# Patient Record
Sex: Male | Born: 1990
Health system: Southern US, Community
[De-identification: ages and names within clinical notes are randomized; demographics above are authoritative.]

## PROBLEM LIST (undated history)

## (undated) DIAGNOSIS — W3400XA Accidental discharge from unspecified firearms or gun, initial encounter: Secondary | ICD-10-CM

## (undated) DIAGNOSIS — T7840XA Allergy, unspecified, initial encounter: Secondary | ICD-10-CM

## (undated) DIAGNOSIS — E785 Hyperlipidemia, unspecified: Secondary | ICD-10-CM

## (undated) DIAGNOSIS — Z87898 Personal history of other specified conditions: Secondary | ICD-10-CM

## (undated) DIAGNOSIS — R001 Bradycardia, unspecified: Secondary | ICD-10-CM

## (undated) DIAGNOSIS — Z8614 Personal history of Methicillin resistant Staphylococcus aureus infection: Secondary | ICD-10-CM

## (undated) HISTORY — DX: Allergy, unspecified, initial encounter: T78.40XA

## (undated) HISTORY — DX: Hyperlipidemia, unspecified: E78.5

## (undated) HISTORY — DX: Personal history of Methicillin resistant Staphylococcus aureus infection: Z86.14

## (undated) HISTORY — DX: Accidental discharge from unspecified firearms or gun, initial encounter: W34.00XA

## (undated) HISTORY — DX: Bradycardia, unspecified: R00.1

---
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
## 2011-08-30 DIAGNOSIS — W3400XA Accidental discharge from unspecified firearms or gun, initial encounter: Secondary | ICD-10-CM

## 2011-08-30 HISTORY — DX: Accidental discharge from unspecified firearms or gun, initial encounter: W34.00XA

## 2014-08-29 HISTORY — PX: WISDOM TOOTH EXTRACTION: SHX21

## 2015-01-29 ENCOUNTER — Ambulatory Visit (INDEPENDENT_AMBULATORY_CARE_PROVIDER_SITE_OTHER): Payer: 59 | Admitting: Physician Assistant

## 2015-01-29 ENCOUNTER — Encounter: Payer: Self-pay | Admitting: Physician Assistant

## 2015-01-29 VITALS — BP 122/80 | HR 80 | Temp 97.7°F | Resp 16 | Ht 66.0 in | Wt 141.0 lb

## 2015-01-29 DIAGNOSIS — R5383 Other fatigue: Secondary | ICD-10-CM

## 2015-01-29 DIAGNOSIS — Z79899 Other long term (current) drug therapy: Secondary | ICD-10-CM

## 2015-01-29 DIAGNOSIS — Z91048 Other nonmedicinal substance allergy status: Secondary | ICD-10-CM

## 2015-01-29 DIAGNOSIS — Z0001 Encounter for general adult medical examination with abnormal findings: Secondary | ICD-10-CM

## 2015-01-29 DIAGNOSIS — R6889 Other general symptoms and signs: Secondary | ICD-10-CM

## 2015-01-29 DIAGNOSIS — Z9109 Other allergy status, other than to drugs and biological substances: Secondary | ICD-10-CM | POA: Insufficient documentation

## 2015-01-29 DIAGNOSIS — M25561 Pain in right knee: Secondary | ICD-10-CM

## 2015-01-29 DIAGNOSIS — Z711 Person with feared health complaint in whom no diagnosis is made: Secondary | ICD-10-CM

## 2015-01-29 DIAGNOSIS — M791 Myalgia, unspecified site: Secondary | ICD-10-CM

## 2015-01-29 DIAGNOSIS — E559 Vitamin D deficiency, unspecified: Secondary | ICD-10-CM

## 2015-01-29 LAB — BASIC METABOLIC PANEL WITH GFR
BUN: 15 mg/dL (ref 6–23)
CALCIUM: 9.8 mg/dL (ref 8.4–10.5)
CHLORIDE: 100 meq/L (ref 96–112)
CO2: 26 mEq/L (ref 19–32)
CREATININE: 1.02 mg/dL (ref 0.50–1.35)
GFR, Est African American: >89 mL/min
Glucose, Bld: 87 mg/dL (ref 70–99)
Potassium: 4.2 mEq/L (ref 3.5–5.3)
Sodium: 138 mEq/L (ref 135–145)

## 2015-01-29 LAB — CBC WITH DIFFERENTIAL/PLATELET
Basophils Absolute: 0.1 10*3/uL (ref 0.0–0.1)
Basophils Relative: 1 % (ref 0–1)
EOS PCT: 3 % (ref 0–5)
Eosinophils Absolute: 0.2 10*3/uL (ref 0.0–0.7)
HEMATOCRIT: 44.4 % (ref 39.0–52.0)
HEMOGLOBIN: 15.3 g/dL (ref 13.0–17.0)
LYMPHS ABS: 2 10*3/uL (ref 0.7–4.0)
Lymphocytes Relative: 36 % (ref 12–46)
MCH: 30.3 pg (ref 26.0–34.0)
MCHC: 34.5 g/dL (ref 30.0–36.0)
MCV: 87.9 fL (ref 78.0–100.0)
MPV: 8.7 fL (ref 8.6–12.4)
Monocytes Absolute: 0.6 10*3/uL (ref 0.1–1.0)
Monocytes Relative: 10 % (ref 3–12)
NEUTROS ABS: 2.8 10*3/uL (ref 1.7–7.7)
Neutrophils Relative %: 50 % (ref 43–77)
Platelets: 312 10*3/uL (ref 150–400)
RBC: 5.05 MIL/uL (ref 4.22–5.81)
RDW: 13.6 % (ref 11.5–15.5)
WBC: 5.5 10*3/uL (ref 4.0–10.5)

## 2015-01-29 LAB — HEPATIC FUNCTION PANEL
ALBUMIN: 4.8 g/dL (ref 3.5–5.2)
ALT: 16 U/L (ref 0–53)
AST: 20 U/L (ref 0–37)
Alkaline Phosphatase: 57 U/L (ref 39–117)
BILIRUBIN INDIRECT: 0.7 mg/dL (ref 0.2–1.2)
BILIRUBIN TOTAL: 0.8 mg/dL (ref 0.2–1.2)
Bilirubin, Direct: 0.1 mg/dL (ref 0.0–0.3)
TOTAL PROTEIN: 7.2 g/dL (ref 6.0–8.3)

## 2015-01-29 LAB — HEMOGLOBIN A1C
Hgb A1c MFr Bld: 5.5 % (ref <5.7)
Mean Plasma Glucose: 111 mg/dL (ref <117)

## 2015-01-29 LAB — LIPID PANEL
Cholesterol: 235 mg/dL — ABNORMAL HIGH (ref 0–200)
HDL: 56 mg/dL (ref >=40)
LDL Cholesterol: 168 mg/dL — ABNORMAL HIGH (ref 0–99)
TRIGLYCERIDES: 54 mg/dL (ref <150)
Total CHOL/HDL Ratio: 4.2 Ratio
VLDL: 11 mg/dL (ref 0–40)

## 2015-01-29 LAB — MAGNESIUM: MAGNESIUM: 1.9 mg/dL (ref 1.5–2.5)

## 2015-01-29 LAB — HEPATITIS C ANTIBODY: HCV AB: NEGATIVE

## 2015-01-29 LAB — TSH: TSH: 1.023 u[IU]/mL (ref 0.350–4.500)

## 2015-01-29 LAB — CK: Total CK: 228 U/L (ref 7–232)

## 2015-01-29 MED ORDER — MELOXICAM 15 MG PO TABS: ORAL_TABLET | ORAL | Status: DC

## 2015-01-29 NOTE — Patient Instructions (Signed)
Preventive Care for Adults A healthy lifestyle and preventive care can promote health and wellness. Preventive health guidelines for men include the following key practices: 1. A routine yearly physical is a good way to check with your health care provider about your health and preventative screening. It is a chance to share any concerns and updates on your health and to receive a thorough exam. 2. Visit your dentist for a routine exam and preventative care every 6 months. Brush your teeth twice a day and floss once a day. Good oral hygiene prevents tooth decay and gum disease. 3. The frequency of eye exams is based on your age, health, family medical history, use of contact lenses, and other factors. Follow your health care provider's recommendations for frequency of eye exams. 4. Eat a healthy diet. Foods such as vegetables, fruits, whole grains, low-fat dairy products, and lean protein foods contain the nutrients you need without too many calories. Decrease your intake of foods high in solid fats, added sugars, and salt. Eat the right amount of calories for you.Get information about a proper diet from your health care provider, if necessary. 5. Regular physical exercise is one of the most important things you can do for your health. Most adults should get at least 150 minutes of moderate-intensity exercise (any activity that increases your heart rate and causes you to sweat) each week. In addition, most adults need muscle-strengthening exercises on 2 or more days a week. 6. Maintain a healthy weight. The body mass index (BMI) is a screening tool to identify possible weight problems. It provides an estimate of body fat based on height and weight. Your health care provider can find your BMI and can help you achieve or maintain a healthy weight.For adults 20 years and older: 1. A BMI below 18.5 is considered underweight. 2. A BMI of 18.5 to 24.9 is normal. 3. A BMI of 25 to 29.9 is considered  overweight. 4. A BMI of 30 and above is considered obese. 7. Maintain normal blood lipids and cholesterol levels by exercising and minimizing your intake of saturated fat. Eat a balanced diet with plenty of fruit and vegetables. Blood tests for lipids and cholesterol should begin at age 23 and be repeated every 5 years. If your lipid or cholesterol levels are high, you are over 50, or you are at high risk for heart disease, you may need your cholesterol levels checked more frequently.Ongoing high lipid and cholesterol levels should be treated with medicines if diet and exercise are not working. 8. If you smoke, find out from your health care provider how to quit. If you do not use tobacco, do not start. 9. Lung cancer screening is recommended for adults aged 92-80 years who are at high risk for developing lung cancer because of a history of smoking. A yearly low-dose CT scan of the lungs is recommended for people who have at least a 30-pack-year history of smoking and are a current smoker or have quit within the past 15 years. A pack year of smoking is smoking an average of 1 pack of cigarettes a day for 1 year (for example: 1 pack a day for 30 years or 2 packs a day for 15 years). Yearly screening should continue until the smoker has stopped smoking for at least 15 years. Yearly screening should be stopped for people who develop a health problem that would prevent them from having lung cancer treatment. 10. If you choose to drink alcohol, do not have more than  2 drinks per day. One drink is considered to be 12 ounces (355 mL) of beer, 5 ounces (148 mL) of wine, or 1.5 ounces (44 mL) of liquor. 11. High blood pressure causes heart disease and increases the risk of stroke. Your blood pressure should be checked. Ongoing high blood pressure should be treated with medicines, if weight loss and exercise are not effective. 53. If you are 2-76 years old, ask your health care provider if you should take aspirin to  prevent heart disease. 13. Diabetes screening involves taking a blood sample to check your fasting blood sugar level. Testing should be considered at a younger age or be carried out more frequently if you are overweight and have at least 1 risk factor for diabetes. 14. Colorectal cancer can be detected and often prevented. Most routine colorectal cancer screening begins at the age of 36 and continues through age 22. However, your health care provider may recommend screening at an earlier age if you have risk factors for colon cancer. On a yearly basis, your health care provider may provide home test kits to check for hidden blood in the stool. Use of a small camera at the end of a tube to directly examine the colon (sigmoidoscopy or colonoscopy) can detect the earliest forms of colorectal cancer. Talk to your health care provider about this at age 62, when routine screening begins. Direct exam of the colon should be repeated every 5-10 years through age 106, unless early forms of precancerous polyps or small growths are found. 11. Screening for abdominal aortic aneurysm (AAA)  are recommended for persons over age 78 who have history of hypertensionor who are current or former smokers. 3. Talk with your health care provider about prostate cancer screening. 50. Testicular cancer screening is recommended for adult males. Screening includes self-exam, a health care provider exam, and other screening tests. Consult with your health care provider about any symptoms you have or any concerns you have about testicular cancer. 18. Use sunscreen. Apply sunscreen liberally and repeatedly throughout the day. You should seek shade when your shadow is shorter than you. Protect yourself by wearing long sleeves, pants, a wide-brimmed hat, and sunglasses year round, whenever you are outdoors. 19. Once a month, do a whole-body skin exam, using a mirror to look at the skin on your back. Tell your health care provider about new  moles, moles that have irregular borders, moles that are larger than a pencil eraser, or moles that have changed in shape or color. 20. Stay current with required vaccines (immunizations). 1. Influenza vaccine. All adults should be immunized every year. 2. Tetanus, diphtheria, and acellular pertussis (Td, Tdap) vaccine. An adult who has not previously received Tdap or who does not know his vaccine status should receive 1 dose of Tdap. This initial dose should be followed by tetanus and diphtheria toxoids (Td) booster doses every 10 years. Adults with an unknown or incomplete history of completing a 3-dose immunization series with Td-containing vaccines should begin or complete a primary immunization series including a Tdap dose. Adults should receive a Td booster every 10 years. 3. Zoster vaccine. One dose is recommended for adults aged 27 years or older unless certain conditions are present. 4.  5. Pneumococcal 13-valent conjugate (PCV13) vaccine. When indicated, a person who is uncertain of his immunization history and has no record of immunization should receive the PCV13 vaccine. An adult aged 34 years or older who has certain medical conditions and has not been previously immunized should  receive 1 dose of PCV13 vaccine. This PCV13 should be followed with a dose of pneumococcal polysaccharide (PPSV23) vaccine. The PPSV23 vaccine dose should be obtained at least 8 weeks after the dose of PCV13 vaccine. An adult aged 5 years or older who has certain medical conditions and previously received 1 or more doses of PPSV23 vaccine should receive 1 dose of PCV13. The PCV13 vaccine dose should be obtained 1 or more years after the last PPSV23 vaccine dose. 6.  7. Pneumococcal polysaccharide (PPSV23) vaccine. When PCV13 is also indicated, PCV13 should be obtained first. All adults aged 55 years and older should be immunized. An adult younger than age 45 years who has certain medical conditions should be  immunized. Any person who resides in a nursing home or long-term care facility should be immunized. An adult smoker should be immunized. People with an immunocompromised condition and certain other conditions should receive both PCV13 and PPSV23 vaccines. People with human immunodeficiency virus (HIV) infection should be immunized as soon as possible after diagnosis. Immunization during chemotherapy or radiation therapy should be avoided. Routine use of PPSV23 vaccine is not recommended for American Indians, [place] Natives, or people younger than 65 years unless there are medical conditions that require PPSV23 vaccine. When indicated, people who have unknown immunization and have no record of immunization should receive PPSV23 vaccine. One-time revaccination 5 years after the first dose of PPSV23 is recommended for people aged 19-64 years who have chronic kidney failure, nephrotic syndrome, asplenia, or immunocompromised conditions. People who received 1-2 doses of PPSV23 before age 63 years should receive another dose of PPSV23 vaccine at age 56 years or later if at least 5 years have passed since the previous dose. Doses of PPSV23 are not needed for people immunized with PPSV23 at or after age 66 years. 8. Hepatitis A vaccine. Adults who wish to be protected from this disease, have certain high-risk conditions, work with hepatitis A-infected animals, work in hepatitis A research labs, or travel to or work in countries with a high rate of hepatitis A should be immunized. Adults who were previously unvaccinated and who anticipate close contact with an international adoptee during the first 60 days after arrival in the [place] from a country with a high rate of hepatitis A should be immunized. 9. Hepatitis B vaccine. Adults should be immunized if they wish to be protected from this disease, have certain high-risk conditions, may be exposed to blood or other infectious body fluids, are household contacts or  sex partners of hepatitis B positive people, are clients or workers in certain care facilities, or travel to or work in countries with a high rate of hepatitis B.  Preventive Service / Frequency  Ages 49 to 38 1. Blood pressure check. 2. Lipid and cholesterol check. 3. Hepatitis C blood test.** / For any individual with known risks for hepatitis C. 4. Skin self-exam. / Monthly. 5. Influenza vaccine. / Every year. 6. Tetanus, diphtheria, and acellular pertussis (Tdap, Td) vaccine.** / Consult your health care provider. 1 dose of Td every 10 years. 7. HPV vaccine. / 3 doses over 6 months, if 73 or younger. 8. Measles, mumps, rubella (MMR) vaccine.** / You need at least 1 dose of MMR if you were born in 1957 or later. You may also need a second dose. 9. Pneumococcal 13-valent conjugate (PCV13) vaccine.** / Consult your health care provider. 10. Pneumococcal polysaccharide (PPSV23) vaccine.** / 1 to 2 doses if you smoke cigarettes or if you have certain  conditions. 11. Meningococcal vaccine.** / 1 dose if you are age 65 to 27 years and a Market researcher living in a residence hall, or have one of several medical conditions. You may also need additional booster doses. 12. Hepatitis A vaccine.** / Consult your health care provider. 13. Hepatitis B vaccine.** / Consult your health care provider.  Keeping you healthy  Chlamydia, HIV, and other sexual transmitted disease- Get screened each year until the age of 51 then within three months of each new sexual partner.  Don't smoke- If you do smoke, ask your healthcare provider about quitting. For tips on how to quit, go to www.smokefree.gov or call 1-800-QUIT-NOW.  Be physically active- Exercise 5 days a week for at least 30 minutes.  If you are not already physically active start slow and gradually work up to 30 minutes of moderate physical activity.  Examples of moderate activity include walking briskly, mowing the yard, dancing, swimming  bicycling, etc.  Eat a healthy diet- Eat a variety of healthy foods such as fruits, vegetables, low fat milk, low fat cheese, yogurt, lean meats, poultry, fish, beans, tofu, etc.  For more information on healthy eating, go to www.thenutritionsource.org  Drink alcohol in moderation- Limit alcohol intake two drinks or less a day.  Never drink and drive.  Dentist- Brush and floss teeth twice daily; visit your dentis twice a year.  Depression-Your emotional health is as important as your physical health.  If you're feeling down, losing interest in things you normally enjoy please talk with your healthcare provider.  Gun Safety- If you keep a gun in your home, keep it unloaded and with the safety lock on.  Bullets should be stored separately.  Helmet use- Always wear a helmet when riding a motorcycle, bicycle, rollerblading or skateboarding.  Safe sex- If you may be exposed to a sexually transmitted infection, use a condom  Seat belts- Seat bels can save your life; always wear one.  Smoke/Carbon Monoxide detectors- These detectors need to be installed on the appropriate level of your home.  Replace batteries at least once a year.  Skin Cancer- When out in the sun, cover up and use sunscreen SPF 15 or higher.  Violence- If anyone is threatening or hurting you, please tell your healthcare provider.    Meniscus Tear with Phase II Rehab The meniscus is a C-shaped cartilage structure, located in the knee joint between the thigh bone (femur) and the shinbone (tibia). Two menisci are located in each knee joint: the inner and outer meniscus. The meniscus acts as an adapter between the thigh bone and shinbone, allowing them to fit properly together. It also functions as a shock absorber, to reduce the stress placed on the knee joint and to help supply nutrients to the knee joint cartilage. As people age, the meniscus begins to harden and become more vulnerable to injury. Meniscus tears are a common  injury, especially in older athletes. Inner meniscus tears are more common than outer meniscus tears.  SYMPTOMS  2. Pain in the knee, especially with standing or squatting with the affected leg. 3. Tenderness along the joint line. 4. Swelling in the knee joint (effusion), usually starting 1 to 2 days after injury. 5. Locking or catching of the knee joint, causing inability to straighten the knee completely. 6. Giving way or buckling of the knee. CAUSES  A meniscus tear occurs when a force is placed on the meniscus that is greater than it can handle. Common causes of injury include:  14. Direct hit (trauma) to the knee. 15. Twisting, pivoting, or cutting (rapidly changing direction while running), kneeling or squatting. 16. Without injury, due to aging. RISK INCREASES WITH: 15. Contact sports (football, rugby). 16. Sports in which cleats are used with pivoting (soccer, lacrosse) or sports in which good shoe grip and sudden change in direction are required (racquetball, basketball, squash). 17. Previous knee injury. 18. Associated knee injury, particularly ligament injuries. 19. Poor strength and flexibility. PREVENTION  Warm up and stretch properly before activity.  Maintain physical fitness:  Strength, flexibility, and endurance.  Cardiovascular fitness.  Protect the knee with a brace or elastic bandage.  Wear properly fitted protective equipment (proper cleats for the surface). PROGNOSIS  Sometimes, meniscus tears heal on their own. However, definitive treatment requires surgery, followed by at least 6 weeks of recovery.  RELATED COMPLICATIONS   Recurring symptoms that result in a chronic problem.  Repeated knee injury, especially if sports are resumed too soon after injury or surgery.  Progression of the tear (the tear gets larger), if untreated.  Arthritis of the knee in later years (with or without surgery).  Complications of surgery, including infection, bleeding,  injury to nerves (numbness, weakness, paralysis) continued pain, giving way, locking, nonhealing of meniscus (if repaired), need for further surgery, and knee stiffness (loss of motion). TREATMENT  Treatment first involves the use of ice and medicine, to reduce pain and inflammation. You may find using crutches to walk more comfortable. However, it is okay to bear weight on the injured knee, if the pain will allow it. Surgery is often advised as a definitive treatment. Surgery is performed through an incision near the joint (arthroscopically). The torn piece of the meniscus is removed, and if possible the joint cartilage is repaired. After surgery, the joint must be restrained. After restraint, it is important to perform strengthening and stretching exercises to help regain strength and a full range of motion. These exercises may be completed at home or with a therapist.  MEDICATION   If pain medicine is needed, nonsteroidal anti-inflammatory medicines (aspirin and ibuprofen), or other minor pain relievers (acetaminophen), are often advised.  Do not take pain medicine for 7 days before surgery.  Prescription pain relievers may be given, if your caregiver thinks they are needed. Use only as directed and only as much as you need. HEAT AND COLD:  Cold treatment (icing) should be applied for 10 to 15 minutes every 2 to 3 hours for inflammation and pain, and immediately after activity that aggravates your symptoms. Use ice packs or an ice massage.  Heat treatment may be used before performing stretching and strengthening activities prescribed by your caregiver, physical therapist, or athletic trainer. Use a heat pack or a warm water soak. SEEK MEDICAL CARE IF:  Symptoms get worse or do not improve in 2 weeks, despite treatment.  New, unexplained symptoms develop. (Drugs used in treatment may produce side effects.) EXERCISES RANGE OF MOTION (ROM) AND STRETCHING EXERCISES - Meniscus Tear, Non-operative  Phase II After your physician, physical therapist or athletic trainer feels your knee has made progress significant enough to begin more advanced exercises, he or she may recommend some of the exercises that follow. He or she may also advise you to continue with the exercises which you completed in Phase I of your rehabilitation. While completing these exercises, remember:   Restoring tissue flexibility helps normal motion to return to the joints. This allows healthier, less painful movement and activity.  An effective stretch should  be held for at least 30 seconds.  A stretch should never be painful. You should only feel a gentle lengthening or release in the stretched tissue. STRETCH - Quadriceps, Prone   Lie on your stomach on a firm surface, such as a bed or padded floor.  Bend your right / left knee and grasp your ankle. If you are unable to reach your ankle or pant leg, use a belt around your foot to lengthen your reach.  Gently pull your heel toward your buttocks. Your knee should not slide out to the side. You should feel a stretch in the front of your thigh and knee.  Hold this position for __________ seconds. Repeat __________ times. Complete this stretch __________ times per day.  STRETCH - Knee Extension, Prone  Lie on your stomach on a firm surface, such as a bed or countertop. Place your right / left knee and leg just beyond the edge of the surface. You may wish to place a towel under the far end of your right / left thigh for comfort.  Relax your leg muscles and allow gravity to straighten your knee. Your caregiver may advise you to add an ankle weight, if more resistance is helpful for you.  You should feel a stretch in the back of your right / left knee. Hold this position for __________ seconds. Repeat __________ times. Complete this __________ times per day. STRENGTHENING EXERCISES - Meniscus Tear Phase II These are some of the exercises you may progress to in your  rehabilitation program. It is critical that you follow the instructions of your caregiver. Based on your individual needs, your caregiver may choose a more or less aggressive approach than the exercises presented. Remember:   Strong muscles with good endurance tolerate stress better.  Do the exercises as initially prescribed by your caregiver. Progress slowly with each exercise, gradually increasing the number of repetitions and weight used under his or her guidance. STRENGTH - Quadriceps, Short Arcs   Lie on your back. Place a __________ inch towel roll under your right / left knee, so that the knee bends slightly.  Raise only your lower leg by tightening the muscles in the front of your thigh. Do not allow your thigh to rise.  Hold this position for __________ seconds. Repeat __________ times. Complete this exercise __________ times per day.  OPTIONAL ANKLE WEIGHTS: Begin with ____________________, but DO NOT exceed ____________________. Increase in 1 pound/0.5 kilogram increments. STRENGTH - Quadriceps, Step-Ups   Use a thick book, step or step stool that is __________ inches tall.  Hold a wall or counter for balance only, not support.  Slowly step up with your right / left foot, keeping your knee in line with your hip and foot. Do not allow your knee to bend so far that you cannot see your toes.  Slowly unlock your knee and lower yourself to the starting position. Your muscles, not gravity, should lower you. Repeat __________ times. Complete this exercise __________ times per day.  STRENGTH - Quadriceps, Wall Slides  Follow guidelines for form closely. Increased knee pain often results from poorly placed feet or knees.  Lean against a smooth wall or door and walk your feet out 18-24 inches. Place your feet hip width apart.  Slowly slide down the wall or door until your knees bend __________ degrees.* Keep your knees over your heels, not your toes, and in line with your hips, not  falling to either side.  Hold for __________ seconds. Stand up  to rest for __________ seconds in between each repetition. Repeat __________ times. Complete this exercise __________ times per day. * Your physician, physical therapist or athletic trainer will alter this angle based on your symptoms and progress. STRENGTH - Hamstring, Curls  Lay on your stomach with your legs extended. (If you lay on a bed, your feet may hang over the edge.)  Tighten the muscles in the back of your thigh to bend your right / left knee up to 90 degrees. Keep your hips flat on the bed.  Hold this position for __________ seconds.  Slowly lower your leg back to the starting position. Repeat __________ times. Complete this exercise __________ times per day.  OPTIONAL ANKLE WEIGHTS: Begin with ____________________, but DO NOT exceed ____________________. Increase in 1 pound/0.5 kilogram increments. Document Released: 12/06/2005 Document Revised: 11/07/2011 Document Reviewed: 11/27/2008 [place] Patient Information 2015 [place], [place]. This information is not intended to replace advice given to you by your health care provider. Make sure you discuss any questions you have with your health care provider.
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
## 2015-01-30 LAB — HSV(HERPES SIMPLEX VRS) I + II AB-IGG
HSV 1 Glycoprotein G Ab, IgG: <0.1 IV
HSV 2 Glycoprotein G Ab, IgG: <0.1 IV

## 2015-01-30 LAB — GC/CHLAMYDIA PROBE AMP
CT Probe RNA: NEGATIVE
GC Probe RNA: NEGATIVE

## 2015-01-30 LAB — URINALYSIS, ROUTINE W REFLEX MICROSCOPIC
BILIRUBIN URINE: NEGATIVE
Glucose, UA: NEGATIVE mg/dL
Hgb urine dipstick: NEGATIVE
Ketones, ur: NEGATIVE mg/dL
LEUKOCYTES UA: NEGATIVE
Nitrite: NEGATIVE
PROTEIN: NEGATIVE mg/dL
Specific Gravity, Urine: <1.005 — ABNORMAL LOW (ref 1.005–1.030)
Urobilinogen, UA: 0.2 mg/dL (ref 0.0–1.0)
pH: 7.5 (ref 5.0–8.0)

## 2015-01-30 LAB — VITAMIN D 25 HYDROXY (VIT D DEFICIENCY, FRACTURES): Vit D, 25-Hydroxy: 38 ng/mL (ref 30–100)

## 2015-01-30 LAB — MICROALBUMIN / CREATININE URINE RATIO
Creatinine, Urine: 97 mg/dL
MICROALB/CREAT RATIO: 5.2 mg/g (ref 0.0–30.0)
Microalb, Ur: 0.5 mg/dL (ref <2.0)

## 2015-01-30 LAB — HIV ANTIBODY (ROUTINE TESTING W REFLEX): HIV: NONREACTIVE

## 2015-01-30 LAB — RPR

## 2015-01-30 LAB — SEDIMENTATION RATE: Sed Rate: 1 mm/hr (ref 0–15)

## 2015-09-15 ENCOUNTER — Encounter: Payer: Self-pay | Admitting: Internal Medicine

## 2015-09-15 ENCOUNTER — Ambulatory Visit: Payer: 59 | Admitting: Internal Medicine

## 2015-09-15 VITALS — BP 122/74 | HR 72 | Temp 97.7°F | Resp 16 | Ht 66.0 in | Wt 141.6 lb

## 2015-09-15 DIAGNOSIS — M79642 Pain in left hand: Secondary | ICD-10-CM

## 2015-09-15 MED ORDER — MELOXICAM 15 MG PO TABS: ORAL_TABLET | ORAL | Status: DC

## 2015-09-15 NOTE — Progress Notes (Signed)
   Subjective:    Patient ID: Aaron Hester, male    DOB: Dec 12, 1990, 25 y.o.   MRN: [id number]  Hand Pain  Associated symptoms include numbness.   Patient presents to the office for evaluation of left hand pain x 3 weeks.  He reports that he was working and he dropped a door on his hand.  He reports that his hand has been very sore and has been swelling and getting red.  He reports that it did get somewhat better when he took a break from work.  He has not had any xrays.  He is left hand dominant.  He has been using some warm compresses.  He reports that he is not currently taking any medications for his hand.  He is not elevating it at all.  He did not have bruising or color changes.  He has had some redness.  He has the most pain with finger flexion and grasping.  He has never hurt that hand in the past. He reports that he did have some numbness at the beginning but that has mostly gone away.    Review of Systems  Constitutional: Negative for fever and chills.  Gastrointestinal: Negative for nausea and vomiting.  Musculoskeletal: Positive for joint swelling and arthralgias.  Neurological: Positive for numbness.       Objective:   Physical Exam  Constitutional: He is oriented to person, place, and time. He appears well-developed and well-nourished. No distress.  HENT:  Head: Normocephalic.  Mouth/Throat: Oropharynx is clear and moist. No oropharyngeal exudate.  Cardiovascular: Normal rate, regular rhythm, normal heart sounds and intact distal pulses.  Exam reveals no gallop and no friction rub.   No murmur heard. Pulses:      Radial pulses are 2+ on the right side, and 2+ on the left side.  Pulmonary/Chest: Effort normal and breath sounds normal.  Musculoskeletal: Normal range of motion.       Left hand: He exhibits tenderness and bony tenderness. He exhibits normal range of motion, normal two-point discrimination, normal capillary refill, no deformity, no laceration and no swelling. Normal  sensation noted. Normal strength noted.       Hands: Neurological: He is alert and oriented to person, place, and time.  Skin: Skin is warm and dry. He is not diaphoretic.  Psychiatric: He has a normal mood and affect. His behavior is normal. Judgment and thought content normal.  Nursing note and vitals reviewed.   Filed Vitals:   09/15/15 1638  BP: 122/74  Pulse: 72  Temp: 97.7 F (36.5 C)  Resp: 16         Assessment & Plan:    1. Left hand pain -no evidence of tendon rupture.  Possible boxers fracture, but given distance from injury may be healing well.   - meloxicam (MOBIC) 15 MG tablet; Take one daily with food for 2 weeks, can take with tylenol, can not take with aleve, iburpofen, then as needed daily for pain  Dispense: 30 tablet; Refill: 1 - DG Hand Complete Left; Future

## 2016-02-01 ENCOUNTER — Encounter: Payer: Self-pay | Admitting: Physician Assistant

## 2016-02-01 ENCOUNTER — Ambulatory Visit: Payer: 59 | Admitting: Physician Assistant

## 2016-02-01 VITALS — BP 110/80 | HR 101 | Temp 97.9°F | Resp 16 | Ht 67.0 in | Wt 134.8 lb

## 2016-02-01 DIAGNOSIS — E559 Vitamin D deficiency, unspecified: Secondary | ICD-10-CM

## 2016-02-01 DIAGNOSIS — Z9109 Other allergy status, other than to drugs and biological substances: Secondary | ICD-10-CM

## 2016-02-01 DIAGNOSIS — Z1389 Encounter for screening for other disorder: Secondary | ICD-10-CM

## 2016-02-01 DIAGNOSIS — Z79899 Other long term (current) drug therapy: Secondary | ICD-10-CM

## 2016-02-01 DIAGNOSIS — Z131 Encounter for screening for diabetes mellitus: Secondary | ICD-10-CM

## 2016-02-01 DIAGNOSIS — F17200 Nicotine dependence, unspecified, uncomplicated: Secondary | ICD-10-CM

## 2016-02-01 DIAGNOSIS — R5383 Other fatigue: Secondary | ICD-10-CM

## 2016-02-01 DIAGNOSIS — G8929 Other chronic pain: Secondary | ICD-10-CM

## 2016-02-01 DIAGNOSIS — Z0001 Encounter for general adult medical examination with abnormal findings: Secondary | ICD-10-CM

## 2016-02-01 DIAGNOSIS — R51 Headache: Secondary | ICD-10-CM

## 2016-02-01 DIAGNOSIS — E785 Hyperlipidemia, unspecified: Secondary | ICD-10-CM

## 2016-02-01 DIAGNOSIS — R519 Headache, unspecified: Secondary | ICD-10-CM

## 2016-02-01 LAB — CBC WITH DIFFERENTIAL/PLATELET
BASOS ABS: 0 {cells}/uL (ref 0–200)
Basophils Relative: 0 %
EOS ABS: 228 {cells}/uL (ref 15–500)
Eosinophils Relative: 3 %
HEMATOCRIT: 40.9 % (ref 38.5–50.0)
HEMOGLOBIN: 14.1 g/dL (ref 13.2–17.1)
LYMPHS ABS: 2508 {cells}/uL (ref 850–3900)
Lymphocytes Relative: 33 %
MCH: 30.7 pg (ref 27.0–33.0)
MCHC: 34.5 g/dL (ref 32.0–36.0)
MCV: 89.1 fL (ref 80.0–100.0)
MPV: 9 fL (ref 7.5–12.5)
Monocytes Absolute: 608 cells/uL (ref 200–950)
Monocytes Relative: 8 %
NEUTROS ABS: 4256 {cells}/uL (ref 1500–7800)
NEUTROS PCT: 56 %
Platelets: 332 10*3/uL (ref 140–400)
RBC: 4.59 MIL/uL (ref 4.20–5.80)
RDW: 14.1 % (ref 11.0–15.0)
WBC: 7.6 10*3/uL (ref 3.8–10.8)

## 2016-02-01 MED ORDER — PREDNISONE 20 MG PO TABS: ORAL_TABLET | ORAL | Status: DC

## 2016-02-01 MED ORDER — VARENICLINE TARTRATE 1 MG PO TABS: 1.0000 mg | ORAL_TABLET | Freq: Two times a day (BID) | ORAL | Status: DC

## 2016-02-01 MED ORDER — NICOTINE 14 MG/24HR TD PT24
14.0000 mg | MEDICATED_PATCH | Freq: Every day | TRANSDERMAL | Status: DC
Start: 2016-02-01 — End: 2016-09-05

## 2016-02-01 NOTE — Patient Instructions (Addendum)
Look up Maid's knee or prepatellar bursitis and treatment Rest, ice, avoiding of being on knees or get knee pads  Get back on allergy pill  If you have a smart phone, please look up Smoke Free app, this will help you stay on track and give you information about money you have saved, life that you have gained back and a ton of more information.   We are giving you chantix for smoking cessation. You can do it! And we are here to help! You may have heard some scary side effects about chantix, the three most common I hear about are nausea, crazy dreams and depression.  However, I like for my patients to try to stay on 1/2 a tablet twice a day rather than one tablet twice a day as normally prescribed. This helps decrease the chances of side effects and helps save money by making a one month prescription last two months  Please start the prescription this way:  Start 1/2 tablet by mouth once daily after food with a full glass of water for 3 days Then do 1/2 tablet by mouth twice daily for 4 days. During this first week you can smoke, but try to stop after this week.  At this point we have several options: 1) continue on 1/2 tablet twice a day- which I encourage you to do. You can stay on this dose the rest of the time on the medication or if you still feel the need to smoke you can do one of the two options below. 2) do one tablet in the morning and 1/2 in the evening which helps decrease dreams. 3) do one tablet twice a day.   What if I miss a dose? If you miss a dose, take it as soon as you can. If it is almost time for your next dose, take only that dose. Do not take double or extra doses.  What should I watch for while using this medicine? Visit your doctor or health care professional for regular check ups. Ask for ongoing advice and encouragement from your doctor or healthcare professional, friends, and family to help you quit. If you smoke while on this medication, quit again  Your mouth may  get dry. Chewing sugarless gum or hard candy, and drinking plenty of water may help. Contact your doctor if the problem does not go away or is severe.  You may get drowsy or dizzy. Do not drive, use machinery, or do anything that needs mental alertness until you know how this medicine affects you. Do not stand or sit up quickly, especially if you are an older patient.   The use of this medicine may increase the chance of suicidal thoughts or actions. Pay special attention to how you are responding while on this medicine. Any worsening of mood, or thoughts of suicide or dying should be reported to your health care professional right away.  ADVANTAGES OF QUITTING SMOKING  Within 20 minutes, blood pressure decreases. Your pulse is at normal level.  After 8 hours, carbon monoxide levels in the blood return to normal. Your oxygen level increases.  After 24 hours, the chance of having a heart attack starts to decrease. Your breath, hair, and body stop smelling like smoke.  After 48 hours, damaged nerve endings begin to recover. Your sense of taste and smell improve.  After 72 hours, the body is virtually free of nicotine. Your bronchial tubes relax and breathing becomes easier.  After 2 to 12 weeks, lungs can  hold more air. Exercise becomes easier and circulation improves.  After 1 year, the risk of coronary heart disease is cut in half.  After 5 years, the risk of stroke falls to the same as a nonsmoker.  After 10 years, the risk of lung cancer is cut in half and the risk of other cancers decreases significantly.  After 15 years, the risk of coronary heart disease drops, usually to the level of a nonsmoker.  You will have extra money to spend on things other than cigarettes.     General Headache Without Cause A headache is pain or discomfort felt around the head or neck area. The specific cause of a headache may not be found. There are many causes and types of headaches. A few common  ones are:  Tension headaches.  Migraine headaches.  Cluster headaches.  Chronic daily headaches. HOME CARE INSTRUCTIONS  Watch your condition for any changes. Take these steps to help with your condition: Managing Pain  Take over-the-counter and prescription medicines only as told by your health care provider.  Lie down in a dark, quiet room when you have a headache.  If directed, apply ice to the head and neck area:  Put ice in a plastic bag.  Place a towel between your skin and the bag.  Leave the ice on for 20 minutes, 2-3 times per day.  Use a heating pad or hot shower to apply heat to the head and neck area as told by your health care provider.  Keep lights dim if bright lights bother you or make your headaches worse. Eating and Drinking  Eat meals on a regular schedule.  Limit alcohol use.  Decrease the amount of caffeine you drink, or stop drinking caffeine. General Instructions  Keep all follow-up visits as told by your health care provider. This is important.  Keep a headache journal to help find out what may trigger your headaches. For example, write down:  What you eat and drink.  How much sleep you get.  Any change to your diet or medicines.  Try massage or other relaxation techniques.  Limit stress.  Sit up straight, and do not tense your muscles.  Do not use tobacco products, including cigarettes, chewing tobacco, or e-cigarettes. If you need help quitting, ask your health care provider.  Exercise regularly as told by your health care provider.  Sleep on a regular schedule. Get 7-9 hours of sleep, or the amount recommended by your health care provider. SEEK MEDICAL CARE IF:   Your symptoms are not helped by medicine.  You have a headache that is different from the usual headache.  You have nausea or you vomit.  You have a fever. SEEK IMMEDIATE MEDICAL CARE IF:   Your headache becomes severe.  You have repeated vomiting.  You have  a stiff neck.  You have a loss of vision.  You have problems with speech.  You have pain in the eye or ear.  You have muscular weakness or loss of muscle control.  You lose your balance or have trouble walking.  You feel faint or pass out.  You have confusion.   This information is not intended to replace advice given to you by your health care provider. Make sure you discuss any questions you have with your health care provider.   Document Released: 08/15/2005 Document Revised: 05/06/2015 Document Reviewed: 12/08/2014 Elsevier Interactive Patient Education [employer]2016 [employer] Inc[employer]
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
## 2016-02-02 ENCOUNTER — Encounter: Payer: Self-pay | Admitting: Physician Assistant

## 2016-02-02 LAB — MICROALBUMIN / CREATININE URINE RATIO
CREATININE, URINE: 224 mg/dL (ref 20–370)
MICROALB UR: 0.7 mg/dL
Microalb Creat Ratio: 3 mcg/mg creat (ref <30)

## 2016-02-02 LAB — HEMOGLOBIN A1C
Hgb A1c MFr Bld: 5.7 % — ABNORMAL HIGH (ref <5.7)
Mean Plasma Glucose: 117 mg/dL

## 2016-02-02 LAB — URINALYSIS, ROUTINE W REFLEX MICROSCOPIC
BILIRUBIN URINE: NEGATIVE
Glucose, UA: NEGATIVE
Hgb urine dipstick: NEGATIVE
Ketones, ur: NEGATIVE
Leukocytes, UA: NEGATIVE
Nitrite: NEGATIVE
PROTEIN: NEGATIVE
Specific Gravity, Urine: 1.025 (ref 1.001–1.035)
pH: 6 (ref 5.0–8.0)

## 2016-02-02 LAB — IRON AND TIBC
%SAT: 70 % — ABNORMAL HIGH (ref 15–60)
IRON: 169 ug/dL (ref 50–195)
TIBC: 243 ug/dL — ABNORMAL LOW (ref 250–425)
UIBC: 74 ug/dL — AB (ref 125–400)

## 2016-02-02 LAB — TSH: TSH: 0.41 mIU/L (ref 0.40–4.50)

## 2016-02-02 LAB — HEPATIC FUNCTION PANEL
ALT: 14 U/L (ref 9–46)
AST: 20 U/L (ref 10–40)
Albumin: 4.6 g/dL (ref 3.6–5.1)
Alkaline Phosphatase: 63 U/L (ref 40–115)
Bilirubin, Direct: 0.1 mg/dL (ref <=0.2)
Indirect Bilirubin: 0.6 mg/dL (ref 0.2–1.2)
Total Bilirubin: 0.7 mg/dL (ref 0.2–1.2)
Total Protein: 6.7 g/dL (ref 6.1–8.1)

## 2016-02-02 LAB — BASIC METABOLIC PANEL WITH GFR
BUN: 15 mg/dL (ref 7–25)
CHLORIDE: 103 mmol/L (ref 98–110)
CO2: 26 mmol/L (ref 20–31)
CREATININE: 1.01 mg/dL (ref 0.60–1.35)
Calcium: 9.3 mg/dL (ref 8.6–10.3)
GFR, Est African American: >89 mL/min (ref >=60)
GFR, Est Non African American: >89 mL/min (ref >=60)
GLUCOSE: 74 mg/dL (ref 65–99)
Potassium: 4.1 mmol/L (ref 3.5–5.3)
Sodium: 141 mmol/L (ref 135–146)

## 2016-02-02 LAB — FERRITIN: FERRITIN: 190 ng/mL (ref 20–345)

## 2016-02-02 LAB — VITAMIN D 25 HYDROXY (VIT D DEFICIENCY, FRACTURES): Vit D, 25-Hydroxy: 36 ng/mL (ref 30–100)

## 2016-02-02 LAB — LIPID PANEL
Cholesterol: 208 mg/dL — ABNORMAL HIGH (ref 125–200)
HDL: 55 mg/dL (ref >=40)
LDL CALC: 125 mg/dL (ref <130)
Total CHOL/HDL Ratio: 3.8 Ratio (ref <=5.0)
Triglycerides: 142 mg/dL (ref <150)
VLDL: 28 mg/dL (ref <30)

## 2016-02-02 LAB — MAGNESIUM: Magnesium: 1.9 mg/dL (ref 1.5–2.5)

## 2016-02-02 LAB — VITAMIN B12: Vitamin B-12: 426 pg/mL (ref 200–1100)

## 2016-02-02 LAB — INSULIN, FASTING: Insulin fasting, serum: 4.3 u[IU]/mL (ref 2.0–19.6)

## 2016-03-08 ENCOUNTER — Other Ambulatory Visit: Payer: Self-pay

## 2016-03-08 MED ORDER — BUPROPION HCL ER (XL) 300 MG PO TB24: 300.0000 mg | ORAL_TABLET | ORAL | Status: DC

## 2016-09-05 ENCOUNTER — Ambulatory Visit: Payer: 59 | Admitting: Internal Medicine

## 2016-09-05 ENCOUNTER — Encounter: Payer: Self-pay | Admitting: Internal Medicine

## 2016-09-05 VITALS — BP 126/70 | HR 82 | Temp 98.0°F | Resp 16 | Ht 67.0 in | Wt 137.0 lb

## 2016-09-05 DIAGNOSIS — G8929 Other chronic pain: Secondary | ICD-10-CM

## 2016-09-05 DIAGNOSIS — M25561 Pain in right knee: Principal | ICD-10-CM

## 2016-09-05 MED ORDER — PREDNISONE 20 MG PO TABS: ORAL_TABLET | ORAL | 0 refills | Status: DC

## 2016-09-05 MED ORDER — TRAMADOL HCL 50 MG PO TABS: 50.0000 mg | ORAL_TABLET | Freq: Three times a day (TID) | ORAL | 0 refills | Status: DC | PRN

## 2016-09-05 NOTE — Patient Instructions (Signed)
Patellofemoral Pain Syndrome Introduction Patellofemoral pain syndrome is a condition that involves a softening or breakdown of the tissue (cartilage) on the underside of your kneecap (patella). This causes pain in the front of the knee. The condition is also called runner's knee or chondromalacia patella. Patellofemoral pain syndrome is most common in young adults who are active in sports. Your knee is the largest joint in your body. The patella covers the front of your knee and is attached to muscles above and below your knee. The underside of the patella is covered with a smooth type of cartilage (synovium). The smooth surface helps the patella glide easily when you move your knee. Patellofemoral pain syndrome causes swelling in the joint linings and bone surfaces in your knee. What are the causes? Patellofemoral pain syndrome can be caused by:  Overuse.  Poor alignment of your knee joints.  Weak leg muscles.  A direct blow to your kneecap. What increases the risk? You may be at risk for patellofemoral pain syndrome if you:  Do a lot of activities that can wear down your kneecap. These include:  Running.  Squatting.  Climbing stairs.  Start a new physical activity or exercise program.  Wear shoes that do not fit well.  Do not have good leg strength.  Are overweight. What are the signs or symptoms? Knee pain is the most common symptom of patellofemoral pain syndrome. This may feel like a dull, aching pain underneath your patella, in the front of your knee. There may be a popping or cracking sound when you move your knee. Pain may get worse with:  Exercise.  Climbing stairs.  Running.  Jumping.  Squatting.  Kneeling.  Sitting for a long time.  Moving or pushing on your patella. How is this diagnosed? Your health care provider may be able to diagnose patellofemoral pain syndrome from your symptoms and medical history. You may be asked about your recent physical  activities and which ones cause knee pain. Your health care provider may do a physical exam with certain tests to confirm the diagnosis. These may include:  Moving your patella back and forth.  Checking your range of knee motion.  Having you squat or jump to see if you have pain.  Checking the strength of your leg muscles. An MRI of the knee may also be done. How is this treated? Patellofemoral pain syndrome can usually be treated at home with rest, ice, compression, and elevation (RICE). Other treatments may include:  Nonsteroidal anti-inflammatory drugs (NSAIDs).  Physical therapy to stretch and strengthen your leg muscles.  Shoe inserts (orthotics) to take stress off your knee.  A knee brace or knee support.  Surgery to remove damaged cartilage or move the patella to a better position. The need for surgery is rare. Follow these instructions at home:  Take medicines only as directed by your health care provider.  Rest your knee.  When resting, keep your knee raised above the level of your heart.  Avoid activities that cause knee pain.  Apply ice to the injured area:  Put ice in a plastic bag.  Place a towel between your skin and the bag.  Leave the ice on for 20 minutes, 2-3 times a day.  Use splints, braces, knee supports, or walking aids as directed by your health care provider.  Perform stretching and strengthening exercises as directed by your health care provider or physical therapist.  Keep all follow-up visits as directed by your health care provider. This is important.  Contact a health care provider if:  Your symptoms get worse.  You are not improving with home care. This information is not intended to replace advice given to you by your health care provider. Make sure you discuss any questions you have with your health care provider. Document Released: 08/03/2009 Document Revised: 01/21/2016 Document Reviewed: 11/04/2013  2017 Elsevier   Meniscus  Tear Introduction A meniscus tear is a knee injury in which a piece of the meniscus is torn. The meniscus is a thick, rubbery, wedge-shaped cartilage in the knee. Two menisci are located in each knee. They sit between the upper bone (femur) and lower bone (tibia) that make up the knee joint. Each meniscus acts as a shock absorber for the knee. A torn meniscus is one of the most common types of knee injuries. This injury can range from mild to severe. Surgery may be needed for a severe tear. What are the causes? This injury may be caused by any squatting, twisting, or pivoting movement. Sports-related injuries are the most common cause. These often occur from:  Running and stopping suddenly.  Changing direction.  Being tackled or knocked off your feet. As people get older, their meniscus gets thinner and weaker. In these people, tears can happen more easily, such as from climbing stairs. What increases the risk? This injury is more likely to happen to:  People who play contact sports.  Males.  People who are 38?25 years of age. What are the signs or symptoms? Symptoms of this injury include:  Knee pain, especially at the side of the knee joint. You may feel pain when the injury occurs, or you may only hear a pop and feel pain later.  A feeling that your knee is clicking, catching, locking, or giving way.  Not being able to fully bend or extend your knee.  Bruising or swelling in your knee. How is this diagnosed? This injury may be diagnosed based on your symptoms and a physical exam. The physical exam may include:  Moving your knee in different ways.  Feeling for tenderness.  Listening for a clicking sound.  Checking if your knee locks or catches. You may also have tests, such as:  X-rays.  MRI.  A procedure to look inside your knee with a narrow surgical telescope (arthroscopy). You may be referred to a knee specialist (orthopedic surgeon). How is this  treated? Treatment for this injury depends on the severity of the tear. Treatment for a mild tear may include:  Rest.  Medicine to reduce pain and swelling. This is usually a nonsteroidal anti-inflammatory drug (NSAID).  A knee brace or an elastic sleeve or wrap.  Using crutches or a walker to keep weight off your knee and to help you walk.  Exercises to strengthen your knee (physical therapy). You may need surgery if you have a severe tear or if other treatments are not working. Follow these instructions at home: Managing pain and swelling  Take over-the-counter and prescription medicines only as told by your health care provider.  If directed, apply ice to the injured area:  Put ice in a plastic bag.  Place a towel between your skin and the bag.  Leave the ice on for 20 minutes, 2-3 times per day.  Raise (elevate) the injured area above the level of your heart while you are sitting or lying down. Activity  Do not use the injured limb to support your body weight until your health care provider says that you can. Use crutches  or a walker as told by your health care provider.  Return to your normal activities as told by your health care provider. Ask your health care provider what activities are safe for you.  Perform range-of-motion exercises only as told by your health care provider.  Begin doing exercises to strengthen your knee and leg muscles only as told by your health care provider. After you recover, your health care provider may recommend these exercises to help prevent another injury. General instructions  Use a knee brace or elastic wrap as told by your health care provider.  Keep all follow-up visits as told by your health care provider. This is important. Contact a health care provider if:  You have a fever.  Your knee becomes red, tender, or swollen.  Your pain medicine is not helping.  Your symptoms get worse or do not improve after 2 weeks of home  care. This information is not intended to replace advice given to you by your health care provider. Make sure you discuss any questions you have with your health care provider. Document Released: 11/05/2002 Document Revised: 01/21/2016 Document Reviewed: 12/08/2014  2017 Elsevier

## 2016-09-05 NOTE — Progress Notes (Signed)
Subjective:    Patient ID: Aaron Hester, male    DOB: 25-Jan-1991, 26 y.o.   MRN: [id number]  HPI  Patient is a 26 year old male presenting to the office for evaluation of right knee pain which has been bothering him for 3 years.  He reports that there was no particular injuries to the knee it.  He reports that sometimes it feels like it gets really tight and swollen and then it pops.  He reports that it pops and clicks when he walks.  He denies locking of the knee.  He can sometimes see swelling in the subpatellar area of the knee.  He reports that he has tried a heating pad.  He reports that he has seen the orthopedist about his knee before and got a shot but it only worked for about a month.  He never went back.  He is not taking meloxicam right now but has tried that.  He does take tylenol and advil but not for his knee.  He couldn't tell a difference with taking the medication.  He reports that he was still taking it when his knee started hurting again.   Review of Systems  Constitutional: Negative for chills, diaphoresis and fever.  Respiratory: Negative for chest tightness, shortness of breath and wheezing.   Gastrointestinal: Negative for nausea and vomiting.  Musculoskeletal: Positive for arthralgias and joint swelling.       Objective:   Physical Exam  Constitutional: He is oriented to person, place, and time. He appears well-developed and well-nourished. No distress.  HENT:  Head: Normocephalic.  Mouth/Throat: Oropharynx is clear and moist. No oropharyngeal exudate.  Eyes: Conjunctivae are normal. No scleral icterus.  Neck: Normal range of motion. Neck supple. No JVD present. No thyromegaly present.  Cardiovascular: Normal rate, regular rhythm, normal heart sounds and intact distal pulses.  Exam reveals no gallop and no friction rub.   No murmur heard. Pulmonary/Chest: Effort normal and breath sounds normal. No respiratory distress. He has no wheezes. He has no rales. He exhibits  no tenderness.  Abdominal: Soft. Bowel sounds are normal. He exhibits no distension and no mass. There is no tenderness. There is no rebound and no guarding.  Musculoskeletal: Normal range of motion.       Right knee: He exhibits normal range of motion, no swelling, no effusion, no ecchymosis, no deformity, no laceration, no erythema, normal alignment, no LCL laxity, normal patellar mobility, no bony tenderness, normal meniscus and no MCL laxity. No tenderness found. No medial joint line, no lateral joint line, no MCL, no LCL and no patellar tendon tenderness noted.  Normal ligamentous testing.  Positive McMurrays testing.  Positive patellar grind.  There is palpable crepitus with passive flexion and extension of the knee.    Lymphadenopathy:    He has no cervical adenopathy.  Neurological: He is alert and oriented to person, place, and time. No cranial nerve deficit. Coordination normal.  Skin: Skin is warm and dry. No rash noted. He is not diaphoretic.  Psychiatric: He has a normal mood and affect. His behavior is normal. Judgment and thought content normal.  Nursing note and vitals reviewed.   Vitals:   09/05/16 1111  BP: 126/70  Pulse: 82  Resp: 16  Temp: 98 F (36.7 C)         Assessment & Plan:    1. 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   -get back to ortho.  Likely needs an injection.   - predniSONE (DELTASONE) 20 MG tablet; 3 tabs po daily x 3 days, then 2 tabs x 3 days, then 1.5 tabs x 3 days, then 1 tab x 3 days, then 0.5 tabs x 3 days  Dispense: 27 tablet; Refill: 0 - Ambulatory referral to Orthopedics - MR Knee Right Wo Contrast; Future - traMADol (ULTRAM) 50 MG tablet; Take 1 tablet (50 mg total) by mouth every 8 (eight) hours as needed for moderate pain.  Dispense: 40 tablet; Refill: 0
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
## 2017-02-13 ENCOUNTER — Encounter: Payer: Self-pay | Admitting: Physician Assistant

## 2017-02-13 ENCOUNTER — Ambulatory Visit (INDEPENDENT_AMBULATORY_CARE_PROVIDER_SITE_OTHER): Payer: 59 | Admitting: Physician Assistant

## 2017-02-13 VITALS — BP 116/80 | HR 88 | Temp 97.3°F | Resp 16 | Ht 67.5 in | Wt 137.0 lb

## 2017-02-13 DIAGNOSIS — Z0001 Encounter for general adult medical examination with abnormal findings: Secondary | ICD-10-CM

## 2017-02-13 DIAGNOSIS — F17200 Nicotine dependence, unspecified, uncomplicated: Secondary | ICD-10-CM

## 2017-02-13 DIAGNOSIS — Z0000 Encounter for general adult medical examination without abnormal findings: Secondary | ICD-10-CM | POA: Diagnosis not present

## 2017-02-13 DIAGNOSIS — Z9109 Other allergy status, other than to drugs and biological substances: Secondary | ICD-10-CM

## 2017-02-13 DIAGNOSIS — E785 Hyperlipidemia, unspecified: Secondary | ICD-10-CM

## 2017-02-13 DIAGNOSIS — Z79899 Other long term (current) drug therapy: Secondary | ICD-10-CM

## 2017-02-13 DIAGNOSIS — Z23 Encounter for immunization: Secondary | ICD-10-CM

## 2017-02-13 DIAGNOSIS — R7303 Prediabetes: Secondary | ICD-10-CM

## 2017-02-13 DIAGNOSIS — E559 Vitamin D deficiency, unspecified: Secondary | ICD-10-CM

## 2017-02-13 DIAGNOSIS — Z1389 Encounter for screening for other disorder: Secondary | ICD-10-CM

## 2017-02-13 LAB — CBC WITH DIFFERENTIAL/PLATELET
BASOS PCT: 0 %
Basophils Absolute: 0 cells/uL (ref 0–200)
Eosinophils Absolute: 144 cells/uL (ref 15–500)
Eosinophils Relative: 2 %
HEMATOCRIT: 43.1 % (ref 38.5–50.0)
Hemoglobin: 14.7 g/dL (ref 13.2–17.1)
LYMPHS ABS: 2664 {cells}/uL (ref 850–3900)
LYMPHS PCT: 37 %
MCH: 30.2 pg (ref 27.0–33.0)
MCHC: 34.1 g/dL (ref 32.0–36.0)
MCV: 88.5 fL (ref 80.0–100.0)
MONO ABS: 504 {cells}/uL (ref 200–950)
MPV: 8.8 fL (ref 7.5–12.5)
Monocytes Relative: 7 %
NEUTROS ABS: 3888 {cells}/uL (ref 1500–7800)
Neutrophils Relative %: 54 %
Platelets: 295 10*3/uL (ref 140–400)
RBC: 4.87 MIL/uL (ref 4.20–5.80)
RDW: 13.8 % (ref 11.0–15.0)
WBC: 7.2 10*3/uL (ref 3.8–10.8)

## 2017-02-13 LAB — BASIC METABOLIC PANEL WITH GFR
BUN: 14 mg/dL (ref 7–25)
CO2: 28 mmol/L (ref 20–31)
Calcium: 9.4 mg/dL (ref 8.6–10.3)
Chloride: 102 mmol/L (ref 98–110)
Creat: 1.04 mg/dL (ref 0.60–1.35)
GLUCOSE: 84 mg/dL (ref 65–99)
POTASSIUM: 4.3 mmol/L (ref 3.5–5.3)
Sodium: 139 mmol/L (ref 135–146)

## 2017-02-13 LAB — TSH: TSH: 0.82 mIU/L (ref 0.40–4.50)

## 2017-02-13 LAB — HEPATIC FUNCTION PANEL
ALK PHOS: 61 U/L (ref 40–115)
ALT: 11 U/L (ref 9–46)
AST: 15 U/L (ref 10–40)
Albumin: 4.7 g/dL (ref 3.6–5.1)
BILIRUBIN INDIRECT: 0.4 mg/dL (ref 0.2–1.2)
Bilirubin, Direct: 0.1 mg/dL (ref <=0.2)
TOTAL PROTEIN: 7 g/dL (ref 6.1–8.1)
Total Bilirubin: 0.5 mg/dL (ref 0.2–1.2)

## 2017-02-13 LAB — MAGNESIUM: Magnesium: 2 mg/dL (ref 1.5–2.5)

## 2017-02-13 LAB — LIPID PANEL
Cholesterol: 220 mg/dL — ABNORMAL HIGH (ref <200)
HDL: 43 mg/dL (ref >40)
LDL CALC: 144 mg/dL — AB (ref <100)
TRIGLYCERIDES: 165 mg/dL — AB (ref <150)
Total CHOL/HDL Ratio: 5.1 Ratio — ABNORMAL HIGH (ref <5.0)
VLDL: 33 mg/dL — AB (ref <30)

## 2017-02-14 LAB — MICROALBUMIN / CREATININE URINE RATIO
CREATININE, URINE: 184 mg/dL (ref 20–370)
MICROALB UR: 0.3 mg/dL
Microalb Creat Ratio: 2 mcg/mg creat (ref <30)

## 2017-02-14 LAB — URINALYSIS, ROUTINE W REFLEX MICROSCOPIC
BILIRUBIN URINE: NEGATIVE
Glucose, UA: NEGATIVE
Hgb urine dipstick: NEGATIVE
Ketones, ur: NEGATIVE
Leukocytes, UA: NEGATIVE
NITRITE: NEGATIVE
PROTEIN: NEGATIVE
SPECIFIC GRAVITY, URINE: 1.022 (ref 1.001–1.035)
pH: 7 (ref 5.0–8.0)

## 2017-02-14 LAB — VITAMIN D 25 HYDROXY (VIT D DEFICIENCY, FRACTURES): Vit D, 25-Hydroxy: 28 ng/mL — ABNORMAL LOW (ref 30–100)

## 2017-02-14 LAB — HEMOGLOBIN A1C
Hgb A1c MFr Bld: 5.1 % (ref <5.7)
Mean Plasma Glucose: 100 mg/dL

## 2017-02-14 NOTE — Progress Notes (Signed)
Pt aware of lab results & voiced understanding of those results.
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
## 2018-02-13 ENCOUNTER — Encounter: Payer: Self-pay | Admitting: Adult Health

## 2018-02-13 ENCOUNTER — Encounter: Payer: Self-pay | Admitting: Physician Assistant

## 2018-04-06 DIAGNOSIS — E559 Vitamin D deficiency, unspecified: Secondary | ICD-10-CM | POA: Insufficient documentation
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
## 2018-04-09 ENCOUNTER — Encounter: Payer: Self-pay | Admitting: Adult Health

## 2019-02-14 ENCOUNTER — Encounter: Payer: Self-pay | Admitting: Adult Health

## 2019-03-07 ENCOUNTER — Other Ambulatory Visit: Payer: Self-pay

## 2019-03-07 ENCOUNTER — Encounter: Payer: Self-pay | Admitting: Adult Health

## 2019-03-07 ENCOUNTER — Ambulatory Visit: Payer: 59 | Admitting: Adult Health

## 2019-03-07 VITALS — BP 120/80 | HR 72 | Temp 97.4°F | Resp 16 | Ht 67.0 in | Wt 134.6 lb

## 2019-03-07 DIAGNOSIS — F321 Major depressive disorder, single episode, moderate: Secondary | ICD-10-CM

## 2019-03-07 DIAGNOSIS — F325 Major depressive disorder, single episode, in full remission: Secondary | ICD-10-CM | POA: Insufficient documentation

## 2019-03-07 MED ORDER — ALPRAZOLAM 0.5 MG PO TABS: ORAL_TABLET | ORAL | 0 refills | Status: DC

## 2019-03-07 MED ORDER — CITALOPRAM HYDROBROMIDE 20 MG PO TABS: 20.0000 mg | ORAL_TABLET | Freq: Every day | ORAL | 1 refills | Status: DC

## 2019-03-07 NOTE — Patient Instructions (Signed)
Major Depressive Disorder, Adult Major depressive disorder (MDD) is a mental health condition. MDD often makes you feel sad, hopeless, or helpless. MDD can also cause symptoms in your body. MDD can affect your:  Work.  School.  Relationships.  Other normal activities. MDD can range from mild to very bad. It may occur once (single episode MDD). It can also occur many times (recurrent MDD). The main symptoms of MDD often include:  Feeling sad, depressed, or irritable most of the time.  Loss of interest. MDD symptoms also include:  Sleeping too much or too little.  Eating too much or too little.  A change in your weight.  Feeling tired (fatigue) or having low energy.  Feeling worthless.  Feeling guilty.  Trouble making decisions.  Trouble thinking clearly.  Thoughts of suicide or harming others.  Feeling weak.  Feeling agitated.  Keeping yourself from being around other people (isolation). Follow these instructions at home: Activity  Do these things as told by your doctor: ? Go back to your normal activities. ? Exercise regularly. ? Spend time outdoors. Alcohol  Talk with your doctor about how alcohol can affect your antidepressant medicines.  Do not drink alcohol. Or, limit how much alcohol you drink. ? This means no more than 1 drink a day for nonpregnant women and 2 drinks a day for men. One drink equals one of these:  12 oz of beer.  5 oz of wine.  1 oz of hard liquor. General instructions  Take over-the-counter and prescription medicines only as told by your doctor.  Eat a healthy diet.  Get plenty of sleep.  Find activities that you enjoy. Make time to do them.  Think about joining a support group. Your doctor may be able to suggest a group for you.  Keep all follow-up visits as told by your doctor. This is important. Where to find more information:  [employer] on Mental Illness: ? www.nami.[place]: ? https://carter.com/  National Suicide Prevention Lifeline: ? 775-691-8904. This is free, 24-hour help. Contact a doctor if:  Your symptoms get worse.  You have new symptoms. Get help right away if:  You self-harm.  You see, hear, taste, smell, or feel things that are not present (hallucinate). If you ever feel like you may hurt yourself or others, or have thoughts about taking your own life, get help right away. You can go to your nearest emergency department or call:  Your local emergency services (911 in the [place]  A suicide crisis helpline, such as the National Suicide Prevention Lifeline: ? (781)056-1910. This is open 24 hours a day. This information is not intended to replace advice given to you by your health care provider. Make sure you discuss any questions you have with your health care provider. Document Released: 07/27/2015 Document Revised: 07/28/2017 Document Reviewed: 05/01/2016 Elsevier Patient Education  [place].     Citalopram tablets What is this medicine? CITALOPRAM (sye TAL oh pram) is a medicine for depression. This medicine may be used for other purposes; ask your health care provider or pharmacist if you have questions. COMMON BRAND NAME(S): Celexa What should I tell my health care provider before I take this medicine? They need to know if you have any of these conditions:  bleeding disorders  bipolar disorder or a family history of bipolar disorder  glaucoma  heart disease  history of irregular heartbeat  kidney disease  liver disease  low levels of magnesium or  potassium in the blood  receiving electroconvulsive therapy  seizures  suicidal thoughts, plans, or attempt; a previous suicide attempt by you or a family member  take medicines that treat or prevent blood clots  thyroid disease  an unusual or allergic reaction to citalopram, escitalopram, other medicines, foods, dyes, or preservatives   pregnant or trying to become pregnant  breast-feeding How should I use this medicine? Take this medicine by mouth with a glass of water. Follow the directions on the prescription label. You can take it with or without food. Take your medicine at regular intervals. Do not take your medicine more often than directed. Do not stop taking this medicine suddenly except upon the advice of your doctor. Stopping this medicine too quickly may cause serious side effects or your condition may worsen. A special MedGuide will be given to you by the pharmacist with each prescription and refill. Be sure to read this information carefully each time. Talk to your pediatrician regarding the use of this medicine in children. Special care may be needed. Patients over 164 years old may have a stronger reaction and need a smaller dose. Overdosage: If you think you have taken too much of this medicine contact a poison control center or emergency room at once. NOTE: This medicine is only for you. Do not share this medicine with others. What if I miss a dose? If you miss a dose, take it as soon as you can. If it is almost time for your next dose, take only that dose. Do not take double or extra doses. What may interact with this medicine? Do not take this medicine with any of the following medications:  certain medicines for fungal infections like fluconazole, itraconazole, ketoconazole, posaconazole, voriconazole  cisapride  dronedarone  escitalopram  linezolid  MAOIs like Carbex, Eldepryl, Marplan, Nardil, and Parnate  methylene blue (injected into a vein)  pimozide  thioridazine This medicine may also interact with the following medications:  alcohol  amphetamines  aspirin and aspirin-like medicines  carbamazepine  certain medicines for depression, anxiety, or psychotic disturbances  certain medicines for infections like chloroquine, clarithromycin, erythromycin, furazolidone, isoniazid,  pentamidine  certain medicines for migraine headaches like almotriptan, eletriptan, frovatriptan, naratriptan, rizatriptan, sumatriptan, zolmitriptan  certain medicines for sleep  certain medicines that treat or prevent blood clots like dalteparin, enoxaparin, warfarin  cimetidine  diuretics  dofetilide  fentanyl  lithium  methadone  metoprolol  NSAIDs, medicines for pain and inflammation, like ibuprofen or naproxen  omeprazole  other medicines that prolong the QT interval (cause an abnormal heart rhythm)  procarbazine  rasagiline  supplements like St. John's wort, kava kava, valerian  tramadol  tryptophan  ziprasidone This list may not describe all possible interactions. Give your health care provider a list of all the medicines, herbs, non-prescription drugs, or dietary supplements you use. Also tell them if you smoke, drink alcohol, or use illegal drugs. Some items may interact with your medicine. What should I watch for while using this medicine? Tell your doctor if your symptoms do not get better or if they get worse. Visit your doctor or health care professional for regular checks on your progress. Because it may take several weeks to see the full effects of this medicine, it is important to continue your treatment as prescribed by your doctor. Patients and their families should watch out for new or worsening thoughts of suicide or depression. Also watch out for sudden changes in feelings such as feeling anxious, agitated, panicky, irritable, hostile,  aggressive, impulsive, severely restless, overly excited and hyperactive, or not being able to sleep. If this happens, especially at the beginning of treatment or after a change in dose, call your health care professional. Bonita QuinYou may get drowsy or dizzy. Do not drive, use machinery, or do anything that needs mental alertness until you know how this medicine affects you. Do not stand or sit up quickly, especially if you are  an older patient. This reduces the risk of dizzy or fainting spells. Alcohol may interfere with the effect of this medicine. Avoid alcoholic drinks. Your mouth may get dry. Chewing sugarless gum or sucking hard candy, and drinking plenty of water will help. Contact your doctor if the problem does not go away or is severe. What side effects may I notice from receiving this medicine? Side effects that you should report to your doctor or health care professional as soon as possible:  allergic reactions like skin rash, itching or hives, swelling of the face, lips, or tongue  anxious  black, tarry stools  breathing problems  changes in vision  chest pain  confusion  elevated mood, decreased need for sleep, racing thoughts, impulsive behavior  eye pain  fast, irregular heartbeat  feeling faint or lightheaded, falls  feeling agitated, angry, or irritable  hallucination, loss of contact with reality  loss of balance or coordination  loss of memory  painful or prolonged erections  restlessness, pacing, inability to keep still  seizures  stiff muscles  suicidal thoughts or other mood changes  trouble sleeping  unusual bleeding or bruising  unusually weak or tired  vomiting Side effects that usually do not require medical attention (report to your doctor or health care professional if they continue or are bothersome):  change in appetite or weight  change in sex drive or performance  dizziness  headache  increased sweating  indigestion, nausea  tremors This list may not describe all possible side effects. Call your doctor for medical advice about side effects. You may report side effects to FDA at 1-800-FDA-1088. Where should I keep my medicine? Keep out of reach of children. Store at room temperature between 15 and 30 degrees C (59 and 86 degrees F). Throw away any unused medicine after the expiration date. NOTE: This sheet is a summary. It may not cover  all possible information. If you have questions about this medicine, talk to your doctor, pharmacist, or health care provider.  2020 Elsevier/Gold Standard (2018-08-06 09:05:36)    Alprazolam tablets What is this medicine? ALPRAZOLAM (al PRAY zoe lam) is a benzodiazepine. It is used to treat anxiety and panic attacks. This medicine may be used for other purposes; ask your health care provider or pharmacist if you have questions. COMMON BRAND NAME(S): Xanax What should I tell my health care provider before I take this medicine? They need to know if you have any of these conditions:  an alcohol or drug abuse problem  bipolar disorder, depression, psychosis or other mental health conditions  glaucoma  kidney or liver disease  lung or breathing disease  myasthenia gravis  Parkinson's disease  porphyria  seizures or a history of seizures  suicidal thoughts  an unusual or allergic reaction to alprazolam, other benzodiazepines, foods, dyes, or preservatives  pregnant or trying to get pregnant  breast-feeding How should I use this medicine? Take this medicine by mouth with a glass of water. Follow the directions on the prescription label. Take your medicine at regular intervals. Do not take it more often  than directed. Do not stop taking except on your doctor's advice. A special MedGuide will be given to you by the pharmacist with each prescription and refill. Be sure to read this information carefully each time. Talk to your pediatrician regarding the use of this medicine in children. Special care may be needed. Overdosage: If you think you have taken too much of this medicine contact a poison control center or emergency room at once. NOTE: This medicine is only for you. Do not share this medicine with others. What if I miss a dose? If you miss a dose, take it as soon as you can. If it is almost time for your next dose, take only that dose. Do not take double or extra doses.  What may interact with this medicine? Do not take this medicine with any of the following medications:  certain antiviral medicines for HIV or AIDS like delavirdine, indinavir  certain medicines for fungal infections like ketoconazole and itraconazole  narcotic medicines for cough  sodium oxybate This medicine may also interact with the following medications:  alcohol  antihistamines for allergy, cough and cold  certain antibiotics like clarithromycin, erythromycin, isoniazid, rifampin, rifapentine, rifabutin, and troleandomycin  certain medicines for blood pressure, heart disease, irregular heart beat  certain medicines for depression, like amitriptyline, fluoxetine, sertraline  certain medicines for seizures like carbamazepine, oxcarbazepine, phenobarbital, phenytoin, primidone  cimetidine  cyclosporine  male hormones, like estrogens or progestins and birth control pills, patches, rings, or injections  general anesthetics like halothane, isoflurane, methoxyflurane, propofol  grapefruit juice  local anesthetics like lidocaine, pramoxine, tetracaine  medicines that relax muscles for surgery  narcotic medicines for pain  other antiviral medicines for HIV or AIDS  phenothiazines like chlorpromazine, mesoridazine, prochlorperazine, thioridazine This list may not describe all possible interactions. Give your health care provider a list of all the medicines, herbs, non-prescription drugs, or dietary supplements you use. Also tell them if you smoke, drink alcohol, or use illegal drugs. Some items may interact with your medicine. What should I watch for while using this medicine? Tell your doctor or health care professional if your symptoms do not start to get better or if they get worse. Do not stop taking except on your doctor's advice. You may develop a severe reaction. Your doctor will tell you how much medicine to take. You may get drowsy or dizzy. Do not drive, use  machinery, or do anything that needs mental alertness until you know how this medicine affects you. To reduce the risk of dizzy and fainting spells, do not stand or sit up quickly, especially if you are an older patient. Alcohol may increase dizziness and drowsiness. Avoid alcoholic drinks. If you are taking another medicine that also causes drowsiness, you may have more side effects. Give your health care provider a list of all medicines you use. Your doctor will tell you how much medicine to take. Do not take more medicine than directed. Call emergency for help if you have problems breathing or unusual sleepiness. What side effects may I notice from receiving this medicine? Side effects that you should report to your doctor or health care professional as soon as possible:  allergic reactions like skin rash, itching or hives, swelling of the face, lips, or tongue  breathing problems  confusion  loss of balance or coordination  signs and symptoms of low blood pressure like dizziness; feeling faint or lightheaded, falls; unusually weak or tired  suicidal thoughts or other mood changes Side effects that usually  do not require medical attention (report to your doctor or health care professional if they continue or are bothersome):  dizziness  dry mouth  nausea, vomiting  tiredness This list may not describe all possible side effects. Call your doctor for medical advice about side effects. You may report side effects to FDA at 1-800-FDA-1088. Where should I keep my medicine? Keep out of the reach of children. This medicine can be abused. Keep your medicine in a safe place to protect it from theft. Do not share this medicine with anyone. Selling or giving away this medicine is dangerous and against the law. Store at room temperature between 20 and 25 degrees C (68 and 77 degrees F). This medicine may cause accidental overdose and death if taken by other adults, children, or pets. Mix any  unused medicine with a substance like cat litter or coffee grounds. Then throw the medicine away in a sealed container like a sealed bag or a coffee can with a lid. Do not use the medicine after the expiration date. NOTE: This sheet is a summary. It may not cover all possible information. If you have questions about this medicine, talk to your doctor, pharmacist, or health care provider.  2020 Elsevier/Gold Standard (2015-05-14 13:47:25)
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
## 2019-04-17 ENCOUNTER — Encounter: Payer: Self-pay | Admitting: Adult Health

## 2019-04-17 DIAGNOSIS — Z87898 Personal history of other specified conditions: Secondary | ICD-10-CM | POA: Insufficient documentation

## 2019-04-17 HISTORY — DX: Personal history of other specified conditions: Z87.898
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
## 2019-04-18 ENCOUNTER — Other Ambulatory Visit: Payer: Self-pay

## 2019-04-18 ENCOUNTER — Ambulatory Visit (INDEPENDENT_AMBULATORY_CARE_PROVIDER_SITE_OTHER): Payer: BLUE CROSS/BLUE SHIELD | Admitting: Adult Health

## 2019-04-18 ENCOUNTER — Encounter: Payer: Self-pay | Admitting: Adult Health

## 2019-04-18 VITALS — BP 108/70 | HR 91 | Temp 97.9°F | Ht 67.0 in | Wt 133.0 lb

## 2019-04-18 DIAGNOSIS — Z131 Encounter for screening for diabetes mellitus: Secondary | ICD-10-CM

## 2019-04-18 DIAGNOSIS — Z79899 Other long term (current) drug therapy: Secondary | ICD-10-CM

## 2019-04-18 DIAGNOSIS — Z0000 Encounter for general adult medical examination without abnormal findings: Secondary | ICD-10-CM

## 2019-04-18 DIAGNOSIS — E559 Vitamin D deficiency, unspecified: Secondary | ICD-10-CM

## 2019-04-18 DIAGNOSIS — Z0001 Encounter for general adult medical examination with abnormal findings: Secondary | ICD-10-CM

## 2019-04-18 DIAGNOSIS — Z9109 Other allergy status, other than to drugs and biological substances: Secondary | ICD-10-CM

## 2019-04-18 DIAGNOSIS — E785 Hyperlipidemia, unspecified: Secondary | ICD-10-CM

## 2019-04-18 DIAGNOSIS — F321 Major depressive disorder, single episode, moderate: Secondary | ICD-10-CM

## 2019-04-18 DIAGNOSIS — Z13220 Encounter for screening for lipoid disorders: Secondary | ICD-10-CM

## 2019-04-18 DIAGNOSIS — Z1389 Encounter for screening for other disorder: Secondary | ICD-10-CM | POA: Diagnosis not present

## 2019-04-18 DIAGNOSIS — Z87898 Personal history of other specified conditions: Secondary | ICD-10-CM

## 2019-04-18 DIAGNOSIS — Z1329 Encounter for screening for other suspected endocrine disorder: Secondary | ICD-10-CM

## 2019-04-18 MED ORDER — CITALOPRAM HYDROBROMIDE 40 MG PO TABS: 40.0000 mg | ORAL_TABLET | Freq: Every day | ORAL | 0 refills | Status: DC

## 2019-04-18 MED ORDER — CITALOPRAM HYDROBROMIDE 40 MG PO TABS: 20.0000 mg | ORAL_TABLET | Freq: Every day | ORAL | 0 refills | Status: DC

## 2019-04-18 NOTE — Patient Instructions (Addendum)
Mr. Aaron Hester , Thank you for taking time to come for your Annual Wellness Visit. I appreciate your ongoing commitment to your health goals. Please review the following plan we discussed and let me know if I can assist you in the future.   These are the goals we discussed: Goals    . DIET - INCREASE WATER INTAKE     65 fluid ounces (4-5 bottles) daily of water or sugar free clear liquid daily     . DIET - REDUCE SUGAR INTAKE - REDUCE SODA    . LDL CALC < 130       This is a list of the screening recommended for you and due dates:  Health Maintenance  Topic Date Due  . Flu Shot  03/30/2019  . Tetanus Vaccine  02/14/2027  . HIV Screening  Completed       Recommend cutting down on Bojangles from daily to once or twice per week  Recommend cutting down on soda - replace with mainly water or sugar free clear liquid (not coffee or dark sodas), and reserve soda for occasional sips for flavor rather than main source of fluid intake - at minimum try to switch to a sugar free variety  Recommend you quit dipping - will increase all cancer risks in the long run   Please check with mom/pediatrician whether you had the HPV vaccines - if not, please call your insurance to see if they will cover the gardesil vaccine - recommend you get it if they do to prevent many types of cancers in the future     Know what a healthy weight is for you (roughly BMI <25) and aim to maintain this  Aim for 7+ servings (1/2 cup each) of fruits and vegetables daily  65-80+ fluid ounces of water or unsweet tea (clear liquids) for healthy kidneys  Limit to max 1-2 drink of alcohol per day; avoid smoking/tobacco  Limit animal fats in diet for cholesterol and heart health - choose grass fed whenever available  Avoid highly processed foods, and foods high in saturated/trans fats  Aim for low stress - take time to unwind and care for your mental health  Aim for 150 min of moderate intensity exercise weekly for  heart health, and weights twice weekly for bone health  Aim for 7-9 hours of sleep daily      Preventing High Cholesterol Cholesterol is a white, waxy substance similar to fat that the human body needs to help build cells. The liver makes all the cholesterol that a person's body needs. Having high cholesterol (hypercholesterolemia) increases a person's risk for heart disease and stroke. Extra (excess) cholesterol comes from the food the person eats. High cholesterol can often be prevented with diet and lifestyle changes. If you already have high cholesterol, you can control it with diet and lifestyle changes and with medicine. How can high cholesterol affect me? If you have high cholesterol, deposits (plaques) may build up on the walls of your arteries. The arteries are the blood vessels that carry blood away from your heart. Plaques make the arteries narrower and stiffer. This can limit or block blood flow and cause blood clots to form. Blood clots:  Are tiny balls of cells that form in your blood.  Can move to the heart or brain, causing a heart attack or stroke. Plaques in arteries greatly increase your risk for heart attack and stroke.Making diet and lifestyle changes can reduce your risk for these conditions that may threaten your  life. What can increase my risk? This condition is more likely to develop in people who:  Eat foods that are high in saturated fat or cholesterol. Saturated fat is mostly found in: ? Foods that contain animal fat, such as red meat and some dairy products. ? Certain fatty foods made from plants, such as tropical oils.  Are overweight.  Are not getting enough exercise.  Have a family history of high cholesterol. What actions can I take to prevent this? Nutrition   Eat less saturated fat.  Avoid trans fats (partially hydrogenated oils). These are often found in margarine and in some baked goods, fried foods, and snacks bought in packages.  Avoid  precooked or cured meat, such as sausages or meat loaves.  Avoid foods and drinks that have added sugars.  Eat more fruits, vegetables, and whole grains.  Choose healthy sources of protein, such as fish, poultry, lean cuts of red meat, beans, peas, lentils, and nuts.  Choose healthy sources of fat, such as: ? Nuts. ? Vegetable oils, especially olive oil. ? Fish that have healthy fats (omega-3 fatty acids), such as mackerel or salmon. The items listed above may not be a complete list of recommended foods and beverages. Contact a dietitian for more information. Lifestyle  Lose weight if you are overweight. Losing 5-10 lb (2.3-4.5 kg) can help prevent or control high cholesterol. It can also lower your risk for diabetes and high blood pressure. Ask your health care provider to help you with a diet and exercise plan to lose weight safely.  Do not use any products that contain nicotine or tobacco, such as cigarettes, e-cigarettes, and chewing tobacco. If you need help quitting, ask your health care provider.  Limit your alcohol intake. ? Do not drink alcohol if:  Your health care provider tells you not to drink.  You are pregnant, may be pregnant, or are planning to become pregnant. ? If you drink alcohol:  Limit how much you use to:  0-1 drink a day for women.  0-2 drinks a day for men.  Be aware of how much alcohol is in your drink. In the [place] one drink equals one 12 oz bottle of beer (355 mL), one 5 oz glass of wine (148 mL), or one 1 oz glass of hard liquor (44 mL). Activity   Get enough exercise. Each week, do at least 150 minutes of exercise that takes a medium level of effort (moderate-intensity exercise). ? This is exercise that:  Makes your heart beat faster and makes you breathe harder than usual.  Allows you to still be able to talk. ? You could exercise in short sessions several times a day or longer sessions a few times a week. For example, on 5 days each week,  you could walk fast or ride your bike 3 times a day for 10 minutes each time.  Do exercises as told by your health care provider. Medicines  In addition to diet and lifestyle changes, your health care provider may recommend medicines to help lower cholesterol. This may be a medicine to lower the amount of cholesterol your liver makes. You may need medicine if: ? Diet and lifestyle changes do not lower your cholesterol enough. ? You have high cholesterol and other risk factors for heart disease or stroke.  Take over-the-counter and prescription medicines only as told by your health care provider. General information  Manage your risk factors for high cholesterol. Talk with your health care provider about all your risk  factors and how to lower your risk.  Manage other conditions that you have, such as diabetes or high blood pressure (hypertension).  Have blood tests to check your cholesterol levels at regular points in time as told by your health care provider.  Keep all follow-up visits as told by your health care provider. This is important. Where to find more information  American Heart Association: www.heart.org  National Heart, Lung, and Blood Institute: https://wilson-eaton.com/ Summary  High cholesterol increases your risk for heart disease and stroke. By keeping your cholesterol level low, you can reduce your risk for these conditions.  High cholesterol can often be prevented with diet and lifestyle changes.  Work with your health care provider to manage your risk factors, and have your blood tested regularly. This information is not intended to replace advice given to you by your health care provider. Make sure you discuss any questions you have with your health care provider. Document Released: 08/30/2015 Document Revi    HPV (Human Papillomavirus) Vaccine: What You Need to Know 1. Why get vaccinated? HPV (Human papillomavirus) vaccine can prevent infection with some types of  human papillomavirus. HPV infections can cause certain types of cancers including:  cervical, vaginal and vulvar cancers in women,  penile cancer in men, and  anal cancers in both men and women. HPV vaccine prevents infection from the HPV types that cause over 90% of these cancers. HPV is spread through intimate skin-to-skin or sexual contact. HPV infections are so common that nearly all men and women will get at least one type of HPV at some time in their lives. Most HPV infections go away by themselves within 2 years. But sometimes HPV infections will last longer and can cause cancers later in life. 2. HPV vaccine HPV vaccine is routinely recommended for adolescents at 61 or 27 years of age to ensure they are protected before they are exposed to the virus. HPV vaccine may be given beginning at age 68 years, and as late as age 38 years. Most people older than 26 years will not benefit from HPV vaccination. Talk with your health care provider if you want more information. Most children who get the first dose before 71 years of age need 2 doses of HPV vaccine. Anyone who gets the first dose on or after 27 years of age, and younger people with certain immunocompromising conditions, need 3 doses. Your health care provider can give you more information. HPV vaccine may be given at the same time as other vaccines. 3. Talk with your health care provider Tell your vaccine provider if the person getting the vaccine:  Has had an allergic reaction after a previous dose of HPV vaccine, or has any severe, life-threatening allergies.  Is pregnant. In some cases, your health care provider may decide to postpone HPV vaccination to a future visit. People with minor illnesses, such as a cold, may be vaccinated. People who are moderately or severely ill should usually wait until they recover before getting HPV vaccine. Your health care provider can give you more information. 4. Risks of a vaccine  reaction  Soreness, redness, or swelling where the shot is given can happen after HPV vaccine.  Fever or headache can happen after HPV vaccine. People sometimes faint after medical procedures, including vaccination. Tell your provider if you feel dizzy or have vision changes or ringing in the ears. As with any medicine, there is a very remote chance of a vaccine causing a severe allergic reaction, other serious injury,  or death. 5. What if there is a serious problem? An allergic reaction could occur after the vaccinated person leaves the clinic. If you see signs of a severe allergic reaction (hives, swelling of the face and throat, difficulty breathing, a fast heartbeat, dizziness, or weakness), call 9-1-1 and get the person to the nearest hospital. For other signs that concern you, call your health care provider. Adverse reactions should be reported to the Vaccine Adverse Event Reporting System (VAERS). Your health care provider will usually file this report, or you can do it yourself. Visit the VAERS website at www.vaers.LAgents.nohhs.gov or call 570 632 93841-907-288-6299. VAERS is only for reporting reactions, and VAERS staff do not give medical advice. 6. The National Vaccine Injury Compensation Program The [employer] Vaccine Injury Compensation Program (VICP) is a federal program that was created to compensate people who may have been injured by certain vaccines. Visit the VICP website at SpiritualWord.atwww.hrsa.gov/vaccinecompensation or call (508)317-81861-807-392-1303 to learn about the program and about filing a claim. There is a time limit to file a claim for compensation. 7. How can I learn more?  Ask your health care provider.  Call your local or state health department.  Contact the Centers for Disease Control and Prevention (CDC): ? Call 573-635-63041-(319)400-0129 (1-800-CDC-INFO) or ? Visit CDC's website at PicCapture.uywww.cdc.gov/vaccines Vaccine Information Statement HPV Vaccine (06/27/2018) This information is not intended to replace advice given to  you by your health care provider. Make sure you discuss any questions you have with your health care provider. Document Released: 03/12/2014 Document Revised: 12/04/2018 Document Reviewed: 03/27/2018 Elsevier Patient Education  2020 Elsevier Inc. d: 12/07/2018 Document Reviewed: 04/23/2016 Elsevier Patient Education  2020 [employer]

## 2019-04-19 LAB — COMPLETE METABOLIC PANEL WITH GFR
AG Ratio: 2.2 (calc) (ref 1.0–2.5)
ALT: 30 U/L (ref 9–46)
AST: 21 U/L (ref 10–40)
Albumin: 4.7 g/dL (ref 3.6–5.1)
Alkaline phosphatase (APISO): 61 U/L (ref 36–130)
BUN: 16 mg/dL (ref 7–25)
CO2: 30 mmol/L (ref 20–32)
Calcium: 9.9 mg/dL (ref 8.6–10.3)
Chloride: 103 mmol/L (ref 98–110)
Creat: 1.14 mg/dL (ref 0.60–1.35)
GFR, Est African American: 102 mL/min/{1.73_m2} (ref >=60)
GFR, Est Non African American: 88 mL/min/{1.73_m2} (ref >=60)
Globulin: 2.1 g/dL (calc) (ref 1.9–3.7)
Glucose, Bld: 68 mg/dL (ref 65–99)
Potassium: 4.9 mmol/L (ref 3.5–5.3)
Sodium: 141 mmol/L (ref 135–146)
Total Bilirubin: 0.7 mg/dL (ref 0.2–1.2)
Total Protein: 6.8 g/dL (ref 6.1–8.1)

## 2019-04-19 LAB — CBC WITH DIFFERENTIAL/PLATELET
Absolute Monocytes: 511 cells/uL (ref 200–950)
Basophils Absolute: 48 cells/uL (ref 0–200)
Basophils Relative: 0.7 %
Eosinophils Absolute: 90 cells/uL (ref 15–500)
Eosinophils Relative: 1.3 %
HCT: 46 % (ref 38.5–50.0)
Hemoglobin: 15.6 g/dL (ref 13.2–17.1)
Lymphs Abs: 1601 cells/uL (ref 850–3900)
MCH: 30.8 pg (ref 27.0–33.0)
MCHC: 33.9 g/dL (ref 32.0–36.0)
MCV: 90.7 fL (ref 80.0–100.0)
MPV: 8.8 fL (ref 7.5–12.5)
Monocytes Relative: 7.4 %
Neutro Abs: 4651 cells/uL (ref 1500–7800)
Neutrophils Relative %: 67.4 %
Platelets: 322 10*3/uL (ref 140–400)
RBC: 5.07 10*6/uL (ref 4.20–5.80)
RDW: 13 % (ref 11.0–15.0)
Total Lymphocyte: 23.2 %
WBC: 6.9 10*3/uL (ref 3.8–10.8)

## 2019-04-19 LAB — LIPID PANEL
Cholesterol: 253 mg/dL — ABNORMAL HIGH (ref <200)
HDL: 65 mg/dL (ref >=40)
LDL Cholesterol (Calc): 166 mg/dL (calc) — ABNORMAL HIGH
Non-HDL Cholesterol (Calc): 188 mg/dL (calc) — ABNORMAL HIGH (ref <130)
Total CHOL/HDL Ratio: 3.9 (calc) (ref <5.0)
Triglycerides: 107 mg/dL (ref <150)

## 2019-04-19 LAB — HEMOGLOBIN A1C
Hgb A1c MFr Bld: 5.4 % of total Hgb (ref <5.7)
Mean Plasma Glucose: 108 (calc)
eAG (mmol/L): 6 (calc)

## 2019-04-19 LAB — URINALYSIS, ROUTINE W REFLEX MICROSCOPIC
Bilirubin Urine: NEGATIVE
Glucose, UA: NEGATIVE
Hgb urine dipstick: NEGATIVE
Ketones, ur: NEGATIVE
Leukocytes,Ua: NEGATIVE
Nitrite: NEGATIVE
Protein, ur: NEGATIVE
Specific Gravity, Urine: 1.027 (ref 1.001–1.03)
pH: 6.5 (ref 5.0–8.0)

## 2019-04-19 LAB — TSH: TSH: 0.67 mIU/L (ref 0.40–4.50)

## 2019-04-19 LAB — VITAMIN D 25 HYDROXY (VIT D DEFICIENCY, FRACTURES): Vit D, 25-Hydroxy: 32 ng/mL (ref 30–100)

## 2019-04-25 ENCOUNTER — Telehealth: Payer: Self-pay

## 2019-04-25 NOTE — Telephone Encounter (Signed)
I followed up with patient to find out if he is planning on starting Cholesterol medication or work on diet. Patient has chosen to work on diet.  He inquired about a list of cholesterol foods to watch out for.

## 2019-04-25 NOTE — Telephone Encounter (Signed)
Mailed to patient

## 2019-05-13 ENCOUNTER — Other Ambulatory Visit: Payer: Self-pay

## 2019-05-13 ENCOUNTER — Other Ambulatory Visit: Payer: Self-pay | Admitting: Adult Health

## 2019-05-13 DIAGNOSIS — F321 Major depressive disorder, single episode, moderate: Secondary | ICD-10-CM

## 2019-05-13 MED ORDER — BUPROPION HCL ER (XL) 150 MG PO TB24: 150.0000 mg | ORAL_TABLET | ORAL | 1 refills | Status: DC

## 2019-05-15 ENCOUNTER — Other Ambulatory Visit: Payer: Self-pay | Admitting: Adult Health

## 2019-05-27 ENCOUNTER — Other Ambulatory Visit: Payer: Self-pay | Admitting: Adult Health

## 2019-05-27 DIAGNOSIS — F321 Major depressive disorder, single episode, moderate: Secondary | ICD-10-CM

## 2019-05-27 MED ORDER — ALPRAZOLAM 0.5 MG PO TABS: ORAL_TABLET | ORAL | 0 refills | Status: DC

## 2019-05-27 NOTE — Progress Notes (Signed)
Future Appointments  Date Time Provider [place]  05/30/2019 10:30 AM Liane Comber, NP [facility] None  08/15/2019 10:30 AM Liane Comber, NP [facility] None  04/17/2020  9:00 AM Liane Comber, NP [facility] None    PDMP and future OVs verified for xanax refill request.

## 2019-05-29 DIAGNOSIS — Z87891 Personal history of nicotine dependence: Secondary | ICD-10-CM | POA: Insufficient documentation

## 2019-05-29 HISTORY — DX: Personal history of nicotine dependence: Z87.891
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
## 2019-05-30 ENCOUNTER — Ambulatory Visit: Payer: BLUE CROSS/BLUE SHIELD | Admitting: Adult Health

## 2019-05-30 ENCOUNTER — Other Ambulatory Visit: Payer: Self-pay

## 2019-05-30 ENCOUNTER — Telehealth: Payer: Self-pay

## 2019-05-30 ENCOUNTER — Encounter: Payer: Self-pay | Admitting: Adult Health

## 2019-05-30 VITALS — BP 104/62 | HR 93 | Temp 98.2°F | Ht 67.0 in | Wt 140.0 lb

## 2019-05-30 DIAGNOSIS — Z789 Other specified health status: Secondary | ICD-10-CM

## 2019-05-30 DIAGNOSIS — M25512 Pain in left shoulder: Secondary | ICD-10-CM | POA: Diagnosis not present

## 2019-05-30 DIAGNOSIS — R42 Dizziness and giddiness: Secondary | ICD-10-CM

## 2019-05-30 DIAGNOSIS — Z87891 Personal history of nicotine dependence: Secondary | ICD-10-CM

## 2019-05-30 MED ORDER — MECLIZINE HCL 25 MG PO TABS: ORAL_TABLET | ORAL | 1 refills | Status: DC

## 2019-05-30 NOTE — Telephone Encounter (Signed)
States that when he was leaving his appointment today with [place], he had a dull pinching feeling on his left side, under his chest muscle and arm pit area that increasingly got worse and lasted about 15 minutes.

## 2019-05-30 NOTE — Patient Instructions (Addendum)
Checking labs today for dizziness  Sent in meclizine - can try overnight for dizziness - take 1/2-1 tab up to three times daily and let me know how this works after the weekend  We may get a scan of your head if not improving   Recommend restarting your celexa medication, can try xanax and see if this improves dizziness episodes   Will go to the ER if you have an unusual headache, changes vision/speech, imbalance, weakness.  Go to the ER if you have any new weakness in your legs, have trouble controlling your urine or bowels    Dizziness Dizziness is a common problem. It is a feeling of unsteadiness or light-headedness. You may feel like you are about to faint. Dizziness can lead to injury if you stumble or fall. Anyone can become dizzy, but dizziness is more common in older adults. This condition can be caused by a number of things, including medicines, dehydration, or illness. Follow these instructions at home: Eating and drinking  Drink enough fluid to keep your urine clear or pale yellow. This helps to keep you from becoming dehydrated. Try to drink more clear fluids, such as water.  Do not drink alcohol.  Limit your caffeine intake if told to do so by your health care provider. Check ingredients and nutrition facts to see if a food or beverage contains caffeine.  Limit your salt (sodium) intake if told to do so by your health care provider. Check ingredients and nutrition facts to see if a food or beverage contains sodium. Activity  Avoid making quick movements. ? Rise slowly from chairs and steady yourself until you feel okay. ? In the morning, first sit up on the side of the bed. When you feel okay, stand slowly while you hold onto something until you know that your balance is fine.  If you need to stand in one place for a long time, move your legs often. Tighten and relax the muscles in your legs while you are standing.  Do not drive or use heavy machinery if you feel  dizzy.  Avoid bending down if you feel dizzy. Place items in your home so that they are easy for you to reach without leaning over. Lifestyle  Do not use any products that contain nicotine or tobacco, such as cigarettes and e-cigarettes. If you need help quitting, ask your health care provider.  Try to reduce your stress level by using methods such as yoga or meditation. Talk with your health care provider if you need help to manage your stress. General instructions  Watch your dizziness for any changes.  Take over-the-counter and prescription medicines only as told by your health care provider. Talk with your health care provider if you think that your dizziness is caused by a medicine that you are taking.  Tell a friend or a family member that you are feeling dizzy. If he or she notices any changes in your behavior, have this person call your health care provider.  Keep all follow-up visits as told by your health care provider. This is important. Contact a health care provider if:  Your dizziness does not go away.  Your dizziness or light-headedness gets worse.  You feel nauseous.  You have reduced hearing.  You have new symptoms.  You are unsteady on your feet or you feel like the room is spinning. Get help right away if:  You vomit or have diarrhea and are unable to eat or drink anything.  You have problems  talking, walking, swallowing, or using your arms, hands, or legs.  You feel generally weak.  You are not thinking clearly or you have trouble forming sentences. It may take a friend or family member to notice this.  You have chest pain, abdominal pain, shortness of breath, or sweating.  Your vision changes.  You have any bleeding.  You have a severe headache.  You have neck pain or a stiff neck.  You have a fever. These symptoms may represent a serious problem that is an emergency. Do not wait to see if the symptoms will go away. Get medical help right away.  Call your local emergency services (911 in the [place] Do not drive yourself to the hospital. Summary  Dizziness is a feeling of unsteadiness or light-headedness. This condition can be caused by a number of things, including medicines, dehydration, or illness.  Anyone can become dizzy, but dizziness is more common in older adults.  Drink enough fluid to keep your urine clear or pale yellow. Do not drink alcohol.  Avoid making quick movements if you feel dizzy. Monitor your dizziness for any changes. This information is not intended to replace advice given to you by your health care provider. Make sure you discuss any questions you have with your health care provider. Document Released: 02/08/2001 Document Revised: 08/18/2017 Document Reviewed: 09/17/2016 Elsevier Patient Education  2020 [employer].    Meclizine tablets or capsules What is this medicine? MECLIZINE (MEK li zeen) is an antihistamine. It is used to prevent nausea, vomiting, or dizziness caused by motion sickness. It is also used to prevent and treat vertigo (extreme dizziness or a feeling that you or your surroundings are tilting or spinning around). This medicine may be used for other purposes; ask your health care provider or pharmacist if you have questions. COMMON BRAND NAME(S): Antivert, Dramamine Less Drowsy, Dramamine-N, Medivert, Meni-D What should I tell my health care provider before I take this medicine? They need to know if you have any of these conditions:  glaucoma  lung or breathing disease, like asthma  problems urinating  prostate disease  stomach or intestine problems  an unusual or allergic reaction to meclizine, other medicines, foods, dyes, or preservatives  pregnant or trying to get pregnant  breast-feeding How should I use this medicine? Take this medicine by mouth with a glass of water. Follow the directions on the prescription label. If you are using this medicine to prevent motion  sickness, take the dose at least 1 hour before travel. If it upsets your stomach, take it with food or milk. Take your doses at regular intervals. Do not take your medicine more often than directed. Talk to your pediatrician regarding the use of this medicine in children. Special care may be needed. Overdosage: If you think you have taken too much of this medicine contact a poison control center or emergency room at once. NOTE: This medicine is only for you. Do not share this medicine with others. What if I miss a dose? If you miss a dose, take it as soon as you can. If it is almost time for your next dose, take only that dose. Do not take double or extra doses. What may interact with this medicine? Do not take this medicine with any of the following medications:  MAOIs like Carbex, Eldepryl, Marplan, Nardil, and Parnate This medicine may also interact with the following medications:  alcohol  antihistamines for allergy, cough and cold  certain medicines for anxiety or sleep  certain medicines for depression, like amitriptyline, fluoxetine, sertraline  certain medicines for seizures like phenobarbital, primidone  general anesthetics like halothane, isoflurane, methoxyflurane, propofol  local anesthetics like lidocaine, pramoxine, tetracaine  medicines that relax muscles for surgery  narcotic medicines for pain  phenothiazines like chlorpromazine, mesoridazine, prochlorperazine, thioridazine This list may not describe all possible interactions. Give your health care provider a list of all the medicines, herbs, non-prescription drugs, or dietary supplements you use. Also tell them if you smoke, drink alcohol, or use illegal drugs. Some items may interact with your medicine. What should I watch for while using this medicine? Tell your doctor or healthcare professional if your symptoms do not start to get better or if they get worse. You may get drowsy or dizzy. Do not drive, use  machinery, or do anything that needs mental alertness until you know how this medicine affects you. Do not stand or sit up quickly, especially if you are an older patient. This reduces the risk of dizzy or fainting spells. Alcohol may interfere with the effect of this medicine. Avoid alcoholic drinks. Your mouth may get dry. Chewing sugarless gum or sucking hard candy, and drinking plenty of water may help. Contact your doctor if the problem does not go away or is severe. This medicine may cause dry eyes and blurred vision. If you wear contact lenses you may feel some discomfort. Lubricating drops may help. See your eye doctor if the problem does not go away or is severe. What side effects may I notice from receiving this medicine? Side effects that you should report to your doctor or health care professional as soon as possible:  feeling faint or lightheaded, falls  fast, irregular heartbeat Side effects that usually do not require medical attention (report to your doctor or health care professional if they continue or are bothersome):  constipation  headache  trouble passing urine or change in the amount of urine  trouble sleeping  upset stomach This list may not describe all possible side effects. Call your doctor for medical advice about side effects. You may report side effects to FDA at 1-800-FDA-1088. Where should I keep my medicine? Keep out of the reach of children. Store at room temperature between 15 and 30 degrees C (59 and 86 degrees F). Keep container tightly closed. Throw away any unused medicine after the expiration date. NOTE: This sheet is a summary. It may not cover all possible information. If you have questions about this medicine, talk to your doctor, pharmacist, or health care provider.  2020 Elsevier/Gold Standard (2015-09-16 19:41:02)

## 2019-05-31 LAB — CBC WITH DIFFERENTIAL/PLATELET
Absolute Monocytes: 570 cells/uL (ref 200–950)
Basophils Absolute: 50 cells/uL (ref 0–200)
Basophils Relative: 0.8 %
Eosinophils Absolute: 149 cells/uL (ref 15–500)
Eosinophils Relative: 2.4 %
HCT: 43.9 % (ref 38.5–50.0)
Hemoglobin: 15.1 g/dL (ref 13.2–17.1)
Lymphs Abs: 2213 cells/uL (ref 850–3900)
MCH: 31.3 pg (ref 27.0–33.0)
MCHC: 34.4 g/dL (ref 32.0–36.0)
MCV: 90.9 fL (ref 80.0–100.0)
MPV: 9.1 fL (ref 7.5–12.5)
Monocytes Relative: 9.2 %
Neutro Abs: 3218 cells/uL (ref 1500–7800)
Neutrophils Relative %: 51.9 %
Platelets: 345 10*3/uL (ref 140–400)
RBC: 4.83 10*6/uL (ref 4.20–5.80)
RDW: 13.3 % (ref 11.0–15.0)
Total Lymphocyte: 35.7 %
WBC: 6.2 10*3/uL (ref 3.8–10.8)

## 2019-05-31 LAB — COMPLETE METABOLIC PANEL WITH GFR
AG Ratio: 2 (calc) (ref 1.0–2.5)
ALT: 15 U/L (ref 9–46)
AST: 18 U/L (ref 10–40)
Albumin: 4.9 g/dL (ref 3.6–5.1)
Alkaline phosphatase (APISO): 55 U/L (ref 36–130)
BUN: 19 mg/dL (ref 7–25)
CO2: 29 mmol/L (ref 20–32)
Calcium: 10.2 mg/dL (ref 8.6–10.3)
Chloride: 101 mmol/L (ref 98–110)
Creat: 1.33 mg/dL (ref 0.60–1.35)
GFR, Est African American: 84 mL/min/{1.73_m2} (ref >=60)
GFR, Est Non African American: 73 mL/min/{1.73_m2} (ref >=60)
Globulin: 2.4 g/dL (calc) (ref 1.9–3.7)
Glucose, Bld: 61 mg/dL — ABNORMAL LOW (ref 65–99)
Potassium: 4.2 mmol/L (ref 3.5–5.3)
Sodium: 140 mmol/L (ref 135–146)
Total Bilirubin: 0.6 mg/dL (ref 0.2–1.2)
Total Protein: 7.3 g/dL (ref 6.1–8.1)

## 2019-05-31 LAB — URINALYSIS, ROUTINE W REFLEX MICROSCOPIC
Bilirubin Urine: NEGATIVE
Glucose, UA: NEGATIVE
Hgb urine dipstick: NEGATIVE
Ketones, ur: NEGATIVE
Leukocytes,Ua: NEGATIVE
Nitrite: NEGATIVE
Protein, ur: NEGATIVE
Specific Gravity, Urine: 1.018 (ref 1.001–1.03)
pH: 7.5 (ref 5.0–8.0)

## 2019-06-03 ENCOUNTER — Other Ambulatory Visit: Payer: Self-pay | Admitting: Adult Health

## 2019-06-03 DIAGNOSIS — R531 Weakness: Secondary | ICD-10-CM

## 2019-06-03 DIAGNOSIS — R42 Dizziness and giddiness: Secondary | ICD-10-CM

## 2019-06-03 DIAGNOSIS — H81399 Other peripheral vertigo, unspecified ear: Secondary | ICD-10-CM

## 2019-06-03 DIAGNOSIS — R519 Headache, unspecified: Secondary | ICD-10-CM

## 2019-06-03 NOTE — Telephone Encounter (Signed)
Patient is aware 

## 2019-06-04 ENCOUNTER — Ambulatory Visit
Admission: RE | Admit: 2019-06-04 | Discharge: 2019-06-04 | Disposition: A | Discharge status: Home / Self Care | Payer: BLUE CROSS/BLUE SHIELD | Source: Ambulatory Visit | Attending: Adult Health | Admitting: Adult Health

## 2019-06-04 DIAGNOSIS — M25512 Pain in left shoulder: Secondary | ICD-10-CM | POA: Diagnosis not present

## 2019-06-04 DIAGNOSIS — Z789 Other specified health status: Secondary | ICD-10-CM

## 2019-06-10 NOTE — Addendum Note (Signed)
Addended by: Izora Ribas on: 06/10/2019 10:36 AM   Modules accepted: Orders

## 2019-06-11 ENCOUNTER — Encounter: Payer: BLUE CROSS/BLUE SHIELD | Admitting: Adult Health

## 2019-07-03 ENCOUNTER — Other Ambulatory Visit: Payer: Self-pay

## 2019-07-03 DIAGNOSIS — F321 Major depressive disorder, single episode, moderate: Secondary | ICD-10-CM

## 2019-07-03 MED ORDER — CITALOPRAM HYDROBROMIDE 40 MG PO TABS: 40.0000 mg | ORAL_TABLET | Freq: Every day | ORAL | 0 refills | Status: DC
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
## 2019-08-15 ENCOUNTER — Ambulatory Visit: Payer: BLUE CROSS/BLUE SHIELD | Admitting: Adult Health
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
## 2019-08-27 ENCOUNTER — Ambulatory Visit: Payer: BLUE CROSS/BLUE SHIELD | Admitting: Adult Health

## 2019-08-27 ENCOUNTER — Encounter: Payer: Self-pay | Admitting: Adult Health

## 2019-08-27 ENCOUNTER — Other Ambulatory Visit: Payer: Self-pay

## 2019-08-27 VITALS — BP 118/76 | HR 74 | Temp 97.5°F | Ht 67.0 in | Wt 151.0 lb

## 2019-08-27 DIAGNOSIS — E559 Vitamin D deficiency, unspecified: Secondary | ICD-10-CM | POA: Diagnosis not present

## 2019-08-27 DIAGNOSIS — E785 Hyperlipidemia, unspecified: Secondary | ICD-10-CM | POA: Diagnosis not present

## 2019-08-27 DIAGNOSIS — Z87898 Personal history of other specified conditions: Secondary | ICD-10-CM | POA: Diagnosis not present

## 2019-08-27 DIAGNOSIS — F321 Major depressive disorder, single episode, moderate: Secondary | ICD-10-CM

## 2019-08-27 DIAGNOSIS — Z87891 Personal history of nicotine dependence: Secondary | ICD-10-CM

## 2019-08-27 DIAGNOSIS — Z6823 Body mass index (BMI) 23.0-23.9, adult: Secondary | ICD-10-CM

## 2019-08-27 NOTE — Patient Instructions (Addendum)
Add daily soluble fiber or eat oatmeal   Please add a daily supplement for vitamin D - 2000 IU daily   Try avocado oil when cooking- healthy for cholesterol but better when heated than olive oil    Choose graff fed beef, eggs (yolks), high fat dairy when possible   Low fat greek yogurt is good for cholesterol and high protein   Ideally limit red meat to <6 oz/week    - most cholesterol comes from animal sources - fatty meat, egg yolks, dairy fat - so recommend limiting your intake of these. It's also important for your heart health to be conscious of trans fats and saturated fats (in highly processed foods - chips, pastries, etc) and avoid these. Focus diet on whole foods, eating mostly plant-based items - fruit/vegetables, beans, nuts/seeds, avocado, olives/olive oil, and whole grains in moderation supplemented by lean proteins and fish if you choose to consume these.       Preventing High Cholesterol Cholesterol is a white, waxy substance similar to fat that the human body needs to help build cells. The liver makes all the cholesterol that a person's body needs. Having high cholesterol (hypercholesterolemia) increases a person's risk for heart disease and stroke. Extra (excess) cholesterol comes from the food the person eats. High cholesterol can often be prevented with diet and lifestyle changes. If you already have high cholesterol, you can control it with diet and lifestyle changes and with medicine. How can high cholesterol affect me? If you have high cholesterol, deposits (plaques) may build up on the walls of your arteries. The arteries are the blood vessels that carry blood away from your heart. Plaques make the arteries narrower and stiffer. This can limit or block blood flow and cause blood clots to form. Blood clots:  Are tiny balls of cells that form in your blood.  Can move to the heart or brain, causing a heart attack or stroke. Plaques in arteries greatly increase  your risk for heart attack and stroke.Making diet and lifestyle changes can reduce your risk for these conditions that may threaten your life. What can increase my risk? This condition is more likely to develop in people who:  Eat foods that are high in saturated fat or cholesterol. Saturated fat is mostly found in: ? Foods that contain animal fat, such as red meat and some dairy products. ? Certain fatty foods made from plants, such as tropical oils.  Are overweight.  Are not getting enough exercise.  Have a family history of high cholesterol. What actions can I take to prevent this? Nutrition   Eat less saturated fat.  Avoid trans fats (partially hydrogenated oils). These are often found in margarine and in some baked goods, fried foods, and snacks bought in packages.  Avoid precooked or cured meat, such as sausages or meat loaves.  Avoid foods and drinks that have added sugars.  Eat more fruits, vegetables, and whole grains.  Choose healthy sources of protein, such as fish, poultry, lean cuts of red meat, beans, peas, lentils, and nuts.  Choose healthy sources of fat, such as: ? Nuts. ? Vegetable oils, especially olive oil. ? Fish that have healthy fats (omega-3 fatty acids), such as mackerel or salmon. The items listed above may not be a complete list of recommended foods and beverages. Contact a dietitian for more information. Lifestyle  Lose weight if you are overweight. Losing 5-10 lb (2.3-4.5 kg) can help prevent or control high cholesterol. It can also lower your  risk for diabetes and high blood pressure. Ask your health care provider to help you with a diet and exercise plan to lose weight safely.  Do not use any products that contain nicotine or tobacco, such as cigarettes, e-cigarettes, and chewing tobacco. If you need help quitting, ask your health care provider.  Limit your alcohol intake. ? Do not drink alcohol if:  Your health care provider tells you not to  drink.  You are pregnant, may be pregnant, or are planning to become pregnant. ? If you drink alcohol:  Limit how much you use to:  0-1 drink a day for women.  0-2 drinks a day for men.  Be aware of how much alcohol is in your drink. In the [place] one drink equals one 12 oz bottle of beer (355 mL), one 5 oz glass of wine (148 mL), or one 1 oz glass of hard liquor (44 mL). Activity   Get enough exercise. Each week, do at least 150 minutes of exercise that takes a medium level of effort (moderate-intensity exercise). ? This is exercise that:  Makes your heart beat faster and makes you breathe harder than usual.  Allows you to still be able to talk. ? You could exercise in short sessions several times a day or longer sessions a few times a week. For example, on 5 days each week, you could walk fast or ride your bike 3 times a day for 10 minutes each time.  Do exercises as told by your health care provider. Medicines  In addition to diet and lifestyle changes, your health care provider may recommend medicines to help lower cholesterol. This may be a medicine to lower the amount of cholesterol your liver makes. You may need medicine if: ? Diet and lifestyle changes do not lower your cholesterol enough. ? You have high cholesterol and other risk factors for heart disease or stroke.  Take over-the-counter and prescription medicines only as told by your health care provider. General information  Manage your risk factors for high cholesterol. Talk with your health care provider about all your risk factors and how to lower your risk.  Manage other conditions that you have, such as diabetes or high blood pressure (hypertension).  Have blood tests to check your cholesterol levels at regular points in time as told by your health care provider.  Keep all follow-up visits as told by your health care provider. This is important. Where to find more information  American Heart Association:  www.heart.org  National Heart, Lung, and Blood Institute: [employer].is Summary  High cholesterol increases your risk for heart disease and stroke. By keeping your cholesterol level low, you can reduce your risk for these conditions.  High cholesterol can often be prevented with diet and lifestyle changes.  Work with your health care provider to manage your risk factors, and have your blood tested regularly. This information is not intended to replace advice given to you by your health care provider. Make sure you discuss any questions you have with your health care provider. Document Released: 08/30/2015 Document Revised: 12/07/2018 Document Reviewed: 04/23/2016 Elsevier Patient Education  2020 [employer].

## 2019-08-28 ENCOUNTER — Other Ambulatory Visit: Payer: Self-pay | Admitting: Adult Health

## 2019-08-28 LAB — COMPLETE METABOLIC PANEL WITH GFR
AG Ratio: 2 (calc) (ref 1.0–2.5)
ALT: 17 U/L (ref 9–46)
AST: 19 U/L (ref 10–40)
Albumin: 4.7 g/dL (ref 3.6–5.1)
Alkaline phosphatase (APISO): 56 U/L (ref 36–130)
BUN: 21 mg/dL (ref 7–25)
CO2: 32 mmol/L (ref 20–32)
Calcium: 9.7 mg/dL (ref 8.6–10.3)
Chloride: 102 mmol/L (ref 98–110)
Creat: 1.03 mg/dL (ref 0.60–1.35)
GFR, Est African American: 115 mL/min/{1.73_m2} (ref >=60)
GFR, Est Non African American: 99 mL/min/{1.73_m2} (ref >=60)
Globulin: 2.3 g/dL (calc) (ref 1.9–3.7)
Glucose, Bld: 85 mg/dL (ref 65–99)
Potassium: 4.7 mmol/L (ref 3.5–5.3)
Sodium: 139 mmol/L (ref 135–146)
Total Bilirubin: 0.7 mg/dL (ref 0.2–1.2)
Total Protein: 7 g/dL (ref 6.1–8.1)

## 2019-08-28 LAB — LIPID PANEL
Cholesterol: 241 mg/dL — ABNORMAL HIGH (ref <200)
HDL: 53 mg/dL (ref >=40)
LDL Cholesterol (Calc): 165 mg/dL (calc) — ABNORMAL HIGH
Non-HDL Cholesterol (Calc): 188 mg/dL (calc) — ABNORMAL HIGH (ref <130)
Total CHOL/HDL Ratio: 4.5 (calc) (ref <5.0)
Triglycerides: 110 mg/dL (ref <150)

## 2019-08-28 MED ORDER — ROSUVASTATIN CALCIUM 20 MG PO TABS: 20.0000 mg | ORAL_TABLET | Freq: Every day | ORAL | 0 refills | Status: DC

## 2019-09-27 ENCOUNTER — Other Ambulatory Visit: Payer: Self-pay | Admitting: Adult Health

## 2019-09-27 DIAGNOSIS — F321 Major depressive disorder, single episode, moderate: Secondary | ICD-10-CM
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
## 2019-11-27 ENCOUNTER — Ambulatory Visit: Payer: BLUE CROSS/BLUE SHIELD | Admitting: Adult Health
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
## 2020-04-17 ENCOUNTER — Encounter: Payer: BLUE CROSS/BLUE SHIELD | Admitting: Adult Health Nurse Practitioner

## 2020-04-17 DIAGNOSIS — Z0000 Encounter for general adult medical examination without abnormal findings: Secondary | ICD-10-CM

## 2020-04-17 DIAGNOSIS — Z9109 Other allergy status, other than to drugs and biological substances: Secondary | ICD-10-CM

## 2020-04-17 DIAGNOSIS — F321 Major depressive disorder, single episode, moderate: Secondary | ICD-10-CM

## 2020-04-17 DIAGNOSIS — Z87898 Personal history of other specified conditions: Secondary | ICD-10-CM

## 2020-04-17 DIAGNOSIS — Z131 Encounter for screening for diabetes mellitus: Secondary | ICD-10-CM

## 2020-04-17 DIAGNOSIS — Z87891 Personal history of nicotine dependence: Secondary | ICD-10-CM

## 2020-04-17 DIAGNOSIS — E785 Hyperlipidemia, unspecified: Secondary | ICD-10-CM

## 2020-04-17 DIAGNOSIS — Z79899 Other long term (current) drug therapy: Secondary | ICD-10-CM

## 2020-04-17 DIAGNOSIS — E559 Vitamin D deficiency, unspecified: Secondary | ICD-10-CM

## 2020-04-17 DIAGNOSIS — Z6823 Body mass index (BMI) 23.0-23.9, adult: Secondary | ICD-10-CM

## 2020-04-17 DIAGNOSIS — E538 Deficiency of other specified B group vitamins: Secondary | ICD-10-CM

## 2020-04-28 ENCOUNTER — Encounter: Payer: Self-pay | Admitting: Adult Health Nurse Practitioner

## 2020-05-15 ENCOUNTER — Encounter: Payer: Self-pay | Admitting: Adult Health
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
## 2020-05-18 ENCOUNTER — Ambulatory Visit (INDEPENDENT_AMBULATORY_CARE_PROVIDER_SITE_OTHER): Payer: Self-pay | Admitting: Adult Health

## 2020-05-18 ENCOUNTER — Encounter: Payer: Self-pay | Admitting: Adult Health

## 2020-05-18 ENCOUNTER — Other Ambulatory Visit: Payer: Self-pay

## 2020-05-18 VITALS — BP 110/72 | HR 75 | Temp 97.5°F | Ht 65.5 in | Wt 140.2 lb

## 2020-05-18 DIAGNOSIS — Z79899 Other long term (current) drug therapy: Secondary | ICD-10-CM

## 2020-05-18 DIAGNOSIS — Z720 Tobacco use: Secondary | ICD-10-CM

## 2020-05-18 DIAGNOSIS — Z9109 Other allergy status, other than to drugs and biological substances: Secondary | ICD-10-CM

## 2020-05-18 DIAGNOSIS — E785 Hyperlipidemia, unspecified: Secondary | ICD-10-CM

## 2020-05-18 DIAGNOSIS — Z87898 Personal history of other specified conditions: Secondary | ICD-10-CM

## 2020-05-18 DIAGNOSIS — F325 Major depressive disorder, single episode, in full remission: Secondary | ICD-10-CM

## 2020-05-18 DIAGNOSIS — Z0000 Encounter for general adult medical examination without abnormal findings: Secondary | ICD-10-CM

## 2020-05-18 DIAGNOSIS — Z6823 Body mass index (BMI) 23.0-23.9, adult: Secondary | ICD-10-CM

## 2020-05-18 DIAGNOSIS — F321 Major depressive disorder, single episode, moderate: Secondary | ICD-10-CM

## 2020-05-18 DIAGNOSIS — E559 Vitamin D deficiency, unspecified: Secondary | ICD-10-CM

## 2020-05-18 MED ORDER — CITALOPRAM HYDROBROMIDE 20 MG PO TABS: 20.0000 mg | ORAL_TABLET | Freq: Every day | ORAL | 3 refills | Status: DC

## 2020-05-18 NOTE — Patient Instructions (Addendum)
Aaron Hester , Thank you for taking time to come for your Annual Wellness Visit. I appreciate your ongoing commitment to your health goals. Please review the following plan we discussed and let me know if I can assist you in the future.   These are the goals we discussed: Goals    . DIET - INCREASE WATER INTAKE     65 fluid ounces (4-5 bottles) daily of water or sugar free clear liquid daily     . DIET - REDUCE SUGAR INTAKE - REDUCE SODA    . LDL CALC < 130       This is a list of the screening recommended for you and due dates:  Health Maintenance  Topic Date Due  . COVID-19 Vaccine (1) 06/03/2020*  . Flu Shot  11/26/2020*  . Tetanus Vaccine  02/14/2027  .  Hepatitis C: One time screening is recommended by Center for Disease Control  (CDC) for  adults born from [birth year] through 1964.   Completed  . HIV Screening  Completed  *Topic was postponed. The date shown is not the original due date.    Try cerave or aveeno lotion on rash If severe/itchy, can try OTC hydrocorisone sparingly - avoid doing this all the time, just occasionally for a few days if needed    Know what a healthy weight is for you (roughly BMI <25) and aim to maintain this  Aim for 7+ servings of fruits and vegetables daily  65-80+ fluid ounces of water or unsweet tea for healthy kidneys  Limit to max 1-2 drink of alcohol per day; ideally max 7 per week   avoid smoking/tobacco  Limit animal fats in diet for cholesterol and heart health - choose grass fed whenever available  Avoid highly processed foods, and foods high in saturated/trans fats  Aim for low stress - take time to unwind and care for your mental health  Aim for 150 min of moderate intensity exercise weekly for heart health, and weights twice weekly for bone health  Aim for 7-9 hours of sleep daily       High-Fiber Diet Fiber, also called dietary fiber, is a type of carbohydrate that is found in fruits, vegetables, whole grains, and beans. A  high-fiber diet can have many health benefits. Your health care provider may recommend a high-fiber diet to help:  Prevent constipation. Fiber can make your bowel movements more regular.  Lower your cholesterol.  Relieve the following conditions: ? Swelling of veins in the anus (hemorrhoids). ? Swelling and irritation (inflammation) of specific areas of the digestive tract (uncomplicated diverticulosis). ? A problem of the large intestine (colon) that sometimes causes pain and diarrhea (irritable bowel syndrome, IBS).  Prevent overeating as part of a weight-loss plan.  Prevent heart disease, type 2 diabetes, and certain cancers. What is my plan? The recommended daily fiber intake in grams (g) includes:  38 g for men age 13 or younger.  30 g for men over age 65.  25 g for women age 7 or younger.  21 g for women over age 8. You can get the recommended daily intake of dietary fiber by:  Eating a variety of fruits, vegetables, grains, and beans.  Taking a fiber supplement, if it is not possible to get enough fiber through your diet. What do I need to know about a high-fiber diet?  It is better to get fiber through food sources rather than from fiber supplements. There is not a lot of research about  how effective supplements are.  Always check the fiber content on the nutrition facts label of any prepackaged food. Look for foods that contain 5 g of fiber or more per serving.  Talk with a diet and nutrition specialist (dietitian) if you have questions about specific foods that are recommended or not recommended for your medical condition, especially if those foods are not listed below.  Gradually increase how much fiber you consume. If you increase your intake of dietary fiber too quickly, you may have bloating, cramping, or gas.  Drink plenty of water. Water helps you to digest fiber. What are tips for following this plan?  Eat a wide variety of high-fiber foods.  Make sure  that half of the grains that you eat each day are whole grains.  Eat breads and cereals that are made with whole-grain flour instead of refined flour or white flour.  Eat brown rice, bulgur wheat, or millet instead of white rice.  Start the day with a breakfast that is high in fiber, such as a cereal that contains 5 g of fiber or more per serving.  Use beans in place of meat in soups, salads, and pasta dishes.  Eat high-fiber snacks, such as berries, raw vegetables, nuts, and popcorn.  Choose whole fruits and vegetables instead of processed forms like juice or sauce. What foods can I eat?  Fruits Berries. Pears. Apples. Oranges. Avocado. Prunes and raisins. Dried figs. Vegetables Sweet potatoes. Spinach. Kale. Artichokes. Cabbage. Broccoli. Cauliflower. Green peas. Carrots. Squash. Grains Whole-grain breads. Multigrain cereal. Oats and oatmeal. Brown rice. Barley. Bulgur wheat. Millet. Quinoa. Bran muffins. Popcorn. Rye wafer crackers. Meats and other proteins Navy, kidney, and pinto beans. Soybeans. Split peas. Lentils. Nuts and seeds. Dairy Fiber-fortified yogurt. Beverages Fiber-fortified soy milk. Fiber-fortified orange juice. Other foods Fiber bars. The items listed above may not be a complete list of recommended foods and beverages. Contact a dietitian for more options. What foods are not recommended? Fruits Fruit juice. Cooked, strained fruit. Vegetables Fried potatoes. Canned vegetables. Well-cooked vegetables. Grains White bread. Pasta made with refined flour. White rice. Meats and other proteins Fatty cuts of meat. Fried chicken or fried fish. Dairy Milk. Yogurt. Cream cheese. Sour cream. Fats and oils Butters. Beverages Soft drinks. Other foods Cakes and pastries. The items listed above may not be a complete list of foods and beverages to avoid. Contact a dietitian for more information. Summary  Fiber is a type of carbohydrate. It is found in fruits,  vegetables, whole grains, and beans.  There are many health benefits of eating a high-fiber diet, such as preventing constipation, lowering blood cholesterol, helping with weight loss, and reducing your risk of heart disease, diabetes, and certain cancers.  Gradually increase your intake of fiber. Increasing too fast can result in cramping, bloating, and gas. Drink plenty of water while you increase your fiber.  The best sources of fiber include whole fruits and vegetables, whole grains, nuts, seeds, and beans. This information is not intended to replace advice given to you by your health care provider. Make sure you discuss any questions you have with your health care provider. Document Revised: 06/19/2017 Document Reviewed: 06/19/2017 Elsevier Patient Education  2020 [employer].

## 2020-05-19 ENCOUNTER — Other Ambulatory Visit: Payer: Self-pay | Admitting: Adult Health

## 2020-05-19 LAB — COMPLETE METABOLIC PANEL WITH GFR
AG Ratio: 2 (calc) (ref 1.0–2.5)
ALT: 15 U/L (ref 9–46)
AST: 19 U/L (ref 10–40)
Albumin: 5 g/dL (ref 3.6–5.1)
Alkaline phosphatase (APISO): 64 U/L (ref 36–130)
BUN: 19 mg/dL (ref 7–25)
CO2: 30 mmol/L (ref 20–32)
Calcium: 10.4 mg/dL — ABNORMAL HIGH (ref 8.6–10.3)
Chloride: 100 mmol/L (ref 98–110)
Creat: 1.06 mg/dL (ref 0.60–1.35)
GFR, Est African American: 110 mL/min/{1.73_m2} (ref >=60)
GFR, Est Non African American: 95 mL/min/{1.73_m2} (ref >=60)
Globulin: 2.5 g/dL (calc) (ref 1.9–3.7)
Glucose, Bld: 81 mg/dL (ref 65–99)
Potassium: 4.6 mmol/L (ref 3.5–5.3)
Sodium: 139 mmol/L (ref 135–146)
Total Bilirubin: 0.6 mg/dL (ref 0.2–1.2)
Total Protein: 7.5 g/dL (ref 6.1–8.1)

## 2020-05-19 LAB — CBC WITH DIFFERENTIAL/PLATELET
Absolute Monocytes: 640 cells/uL (ref 200–950)
Basophils Absolute: 57 cells/uL (ref 0–200)
Basophils Relative: 0.7 %
Eosinophils Absolute: 113 cells/uL (ref 15–500)
Eosinophils Relative: 1.4 %
HCT: 46.1 % (ref 38.5–50.0)
Hemoglobin: 15.4 g/dL (ref 13.2–17.1)
Lymphs Abs: 2357 cells/uL (ref 850–3900)
MCH: 30.1 pg (ref 27.0–33.0)
MCHC: 33.4 g/dL (ref 32.0–36.0)
MCV: 90.2 fL (ref 80.0–100.0)
MPV: 9.2 fL (ref 7.5–12.5)
Monocytes Relative: 7.9 %
Neutro Abs: 4933 cells/uL (ref 1500–7800)
Neutrophils Relative %: 60.9 %
Platelets: 340 10*3/uL (ref 140–400)
RBC: 5.11 10*6/uL (ref 4.20–5.80)
RDW: 13.1 % (ref 11.0–15.0)
Total Lymphocyte: 29.1 %
WBC: 8.1 10*3/uL (ref 3.8–10.8)

## 2020-05-19 LAB — LIPID PANEL
Cholesterol: 209 mg/dL — ABNORMAL HIGH (ref <200)
HDL: 44 mg/dL (ref >=40)
LDL Cholesterol (Calc): 136 mg/dL (calc) — ABNORMAL HIGH
Non-HDL Cholesterol (Calc): 165 mg/dL (calc) — ABNORMAL HIGH (ref <130)
Total CHOL/HDL Ratio: 4.8 (calc) (ref <5.0)
Triglycerides: 157 mg/dL — ABNORMAL HIGH (ref <150)

## 2020-12-04 IMAGING — CR DG SHOULDER 2+V*L*
3 series · 3 of 3 positions shown · non-contrast
Comparison: None.

CLINICAL DATA: Persistent shoulder pain

EXAM:
LEFT SHOULDER - 2+ VIEW

[w shoulder grashey left]
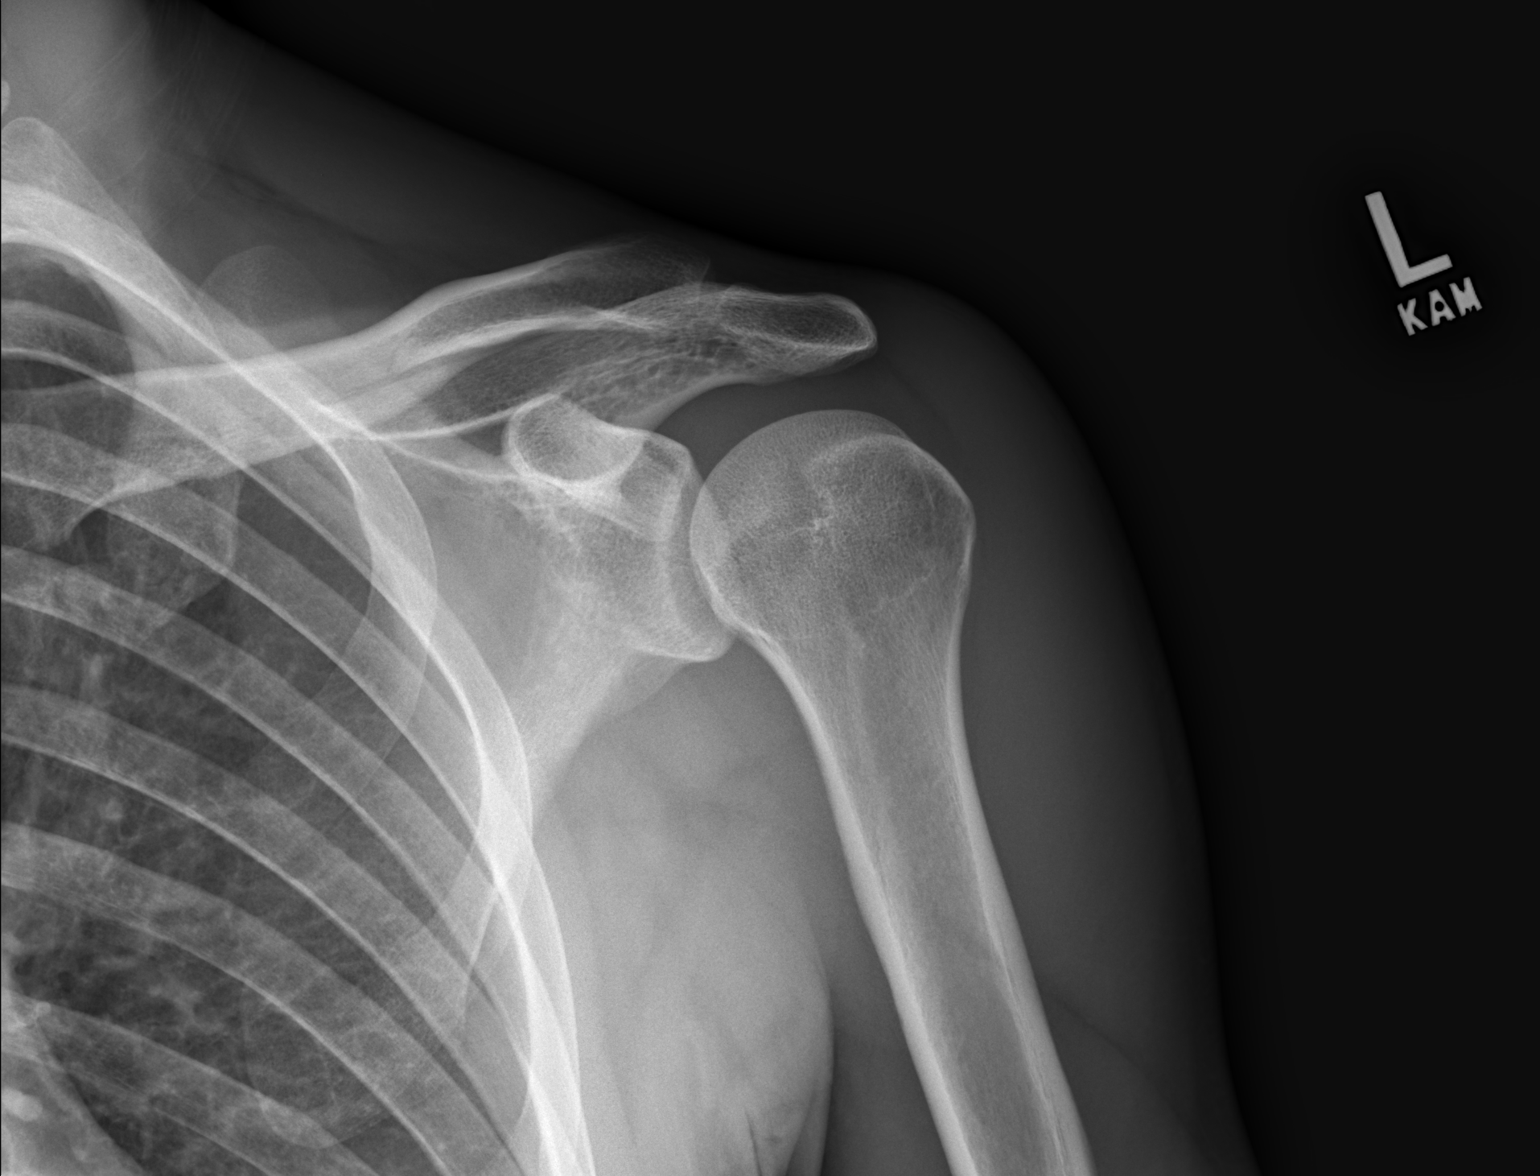

[w shoulder y-view left]
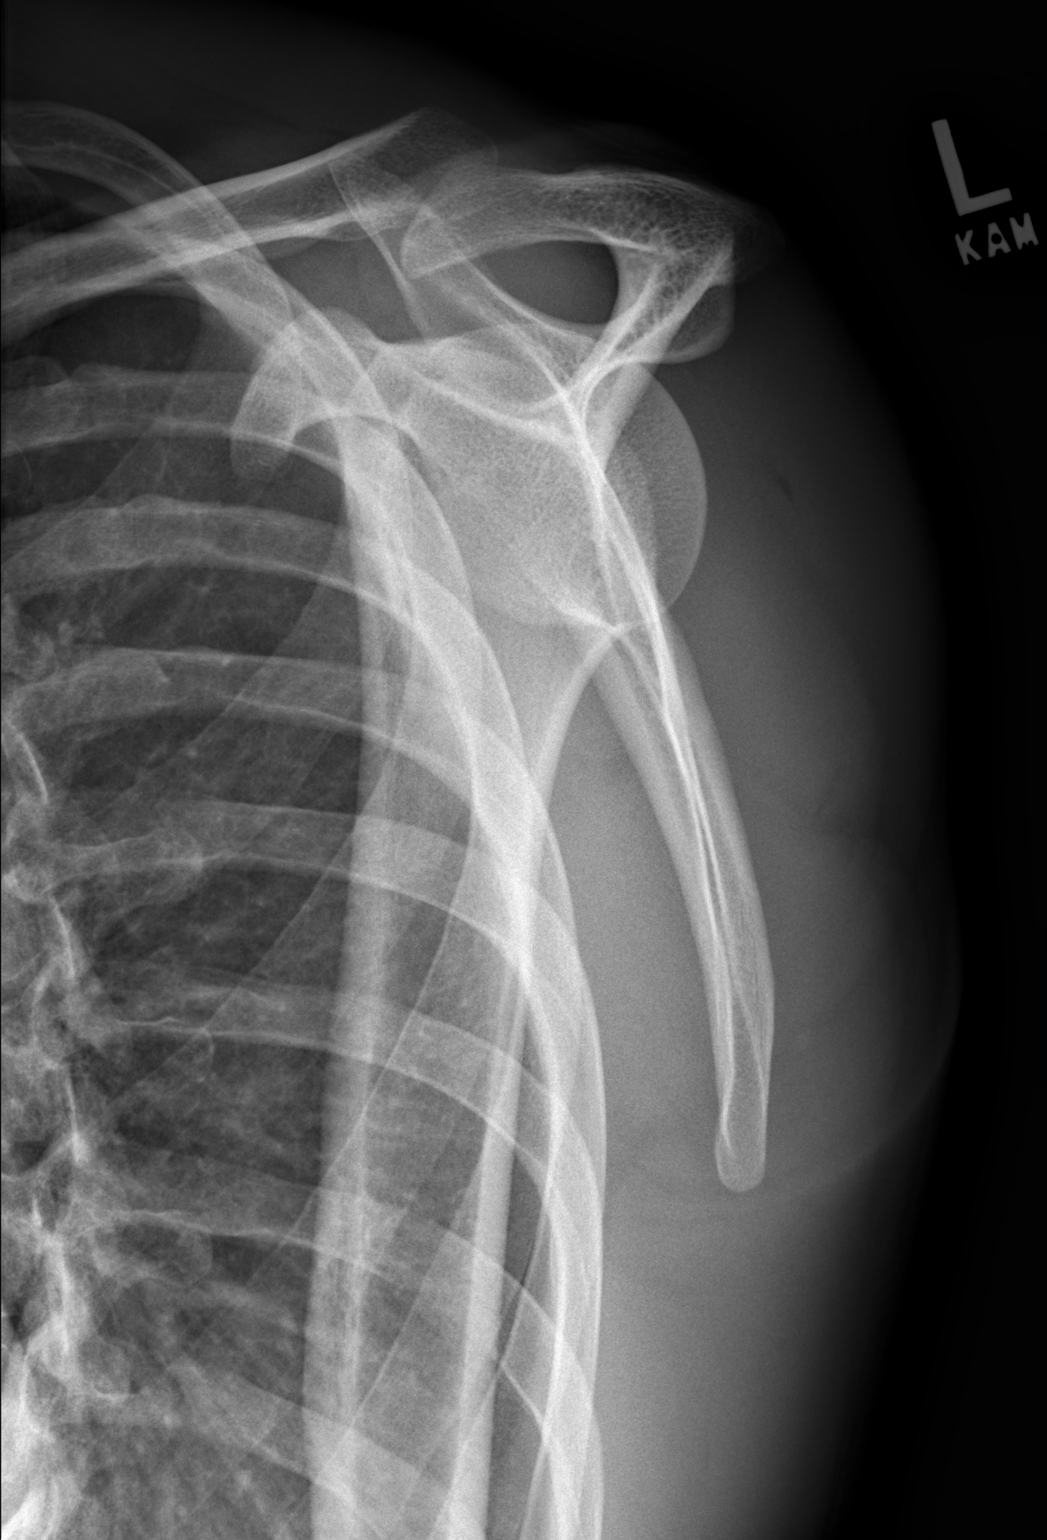

[w shoulder axillary left]
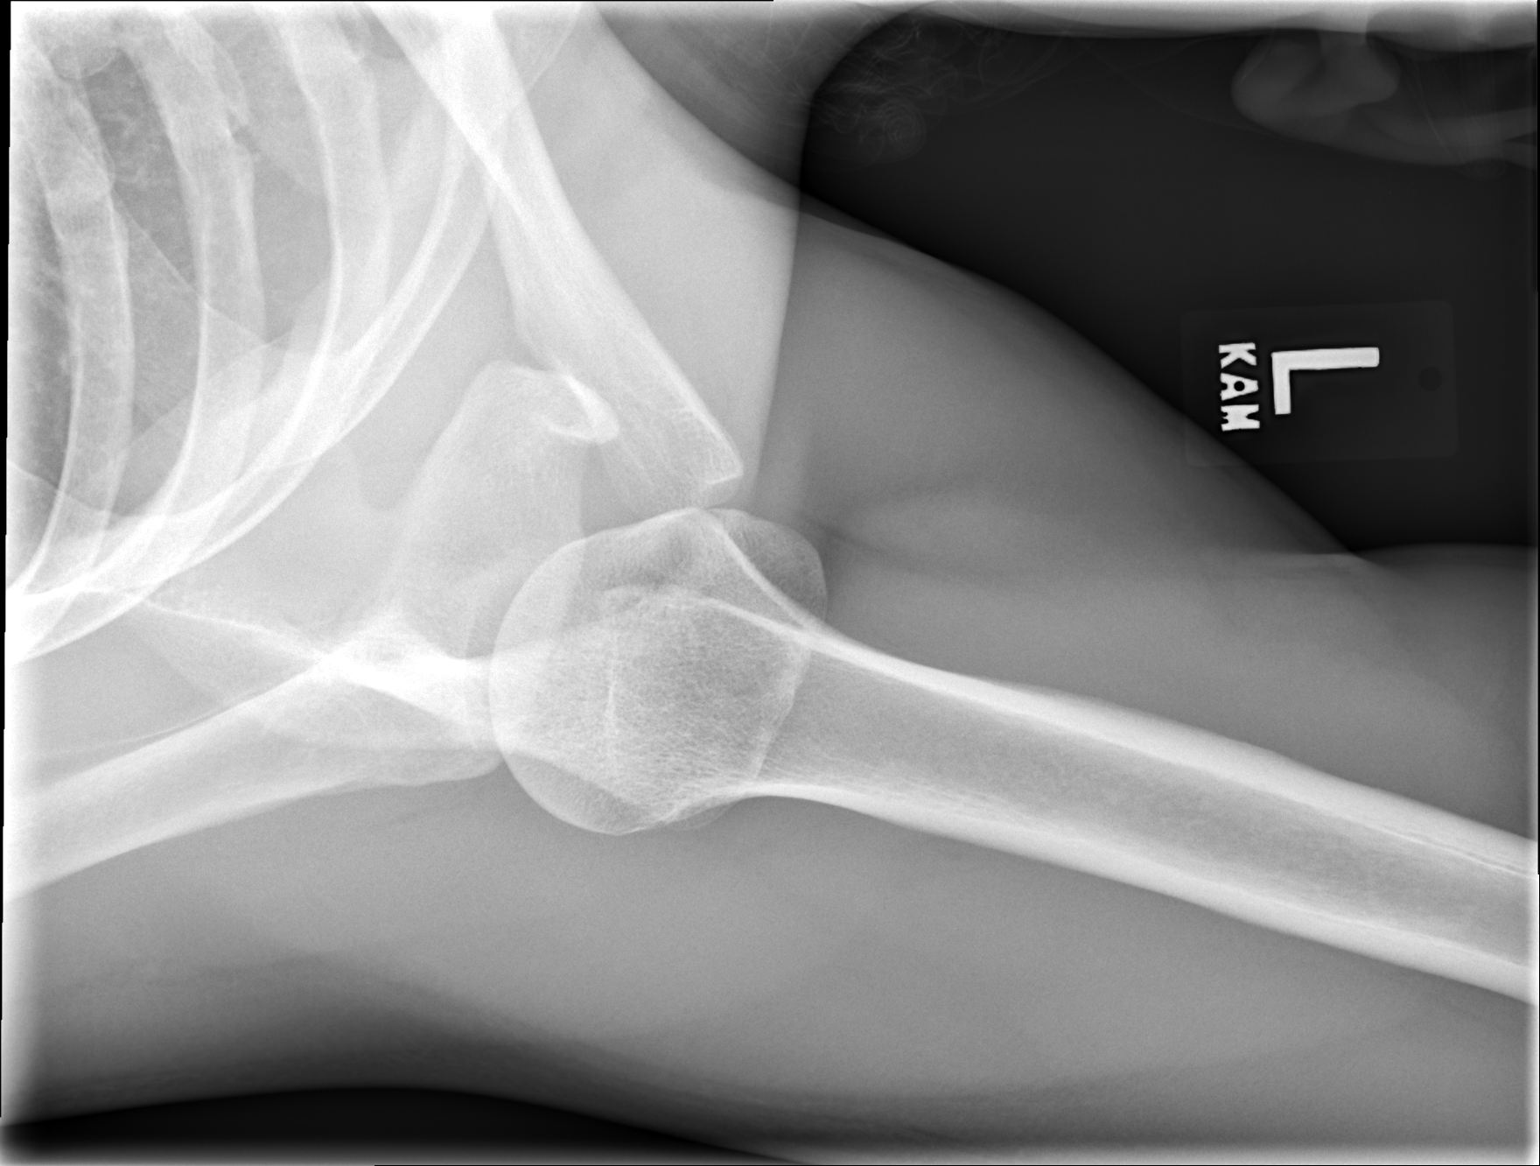

[3 of 3 positions shown; findings below may reference images not displayed]

FINDINGS: There is no evidence of fracture or dislocation. There is no
evidence of arthropathy or other focal bone abnormality. Soft
tissues are unremarkable.
IMPRESSION: Negative.

## 2021-04-29 ENCOUNTER — Encounter: Payer: Self-pay | Admitting: Adult Health Nurse Practitioner

## 2021-05-20 ENCOUNTER — Encounter: Payer: Self-pay | Admitting: Adult Health

## 2021-08-09 NOTE — Progress Notes (Signed)
    Future Appointments  Date Time Provider Department  08/10/2021  9:30 AM Lucky Cowboy, MD [facility]    History of Present Illness:     Patient is a very nice 30 yo WM presenting with a 1 month hx/o a pruritic rash primarialy in his GU, groin & perineal area.     Medications    Vitamin D , Take 2,000 Units  daily.   citalopram  20 MG tablet, Take 1 tablet  daily.  Problem list He has Environmental allergies; Hyperlipidemia; Vitamin D deficiency; Major depression in remission (HCC); History of prediabetes; and Dips tobacco on their problem list.   Observations/Objective:  BP 129/77   Pulse 74   Temp 97.9 F (36.6 C)   Resp 16   Ht 5\' 6"  (1.676 m)   Wt 139 lb 12.8 oz (63.4 kg)   SpO2 99%   BMI 22.56 kg/m   Skin Exam - Rash is suspect for a ringworm with serpiginous pink pattern.    Assessment and Plan:   1. Ringworm of body  - terbinafine (LAMISIL) 250 MG tablet; Take  1 tablet  Daily  for Ringworm  Dispense: 30 tablet; Refill: 0      Follow Up Instructions:        I discussed the assessment and treatment plan with the patient. The patient was provided an opportunity to ask questions and all were answered. The patient agreed with the plan and demonstrated an understanding of the instructions.       The patient was advised to call back or seek an in-person evaluation if the symptoms worsen or if the condition fails to improve as anticipated.    , MD

## 2021-08-10 ENCOUNTER — Ambulatory Visit: Payer: Self-pay | Admitting: Internal Medicine

## 2021-08-10 ENCOUNTER — Encounter: Payer: Self-pay | Admitting: Internal Medicine

## 2021-08-10 ENCOUNTER — Other Ambulatory Visit: Payer: Self-pay

## 2021-08-10 VITALS — BP 129/77 | HR 74 | Temp 97.9°F | Resp 16 | Ht 66.0 in | Wt 139.8 lb

## 2021-08-10 DIAGNOSIS — B354 Tinea corporis: Secondary | ICD-10-CM

## 2021-08-10 MED ORDER — TERBINAFINE HCL 250 MG PO TABS: ORAL_TABLET | ORAL | 0 refills | Status: DC

## 2021-08-10 NOTE — Progress Notes (Signed)
° ° °  Future Appointments  Date Time Provider Department  08/10/2021  9:30 AM Lucky Cowboy, MD [facility]    History of Present Illness:     Patient is a very nice 30 yo WM presenting with a 1 month hx/o a pruritic rash primarialy in his GU, groin & perineal area.     Medications    Vitamin D , Take 2,000 Units  daily.   citalopram  20 MG tablet, Take 1 tablet  daily.  Problem list He has Environmental allergies; Hyperlipidemia; Vitamin D deficiency; Major depression in remission (HCC); History of prediabetes; and Dips tobacco on their problem list.   Observations/Objective:  BP 129/77    Pulse 74    Temp 97.9 F (36.6 C)    Resp 16    Ht 5\' 6"  (1.676 m)    Wt 139 lb 12.8 oz (63.4 kg)    SpO2 99%    BMI 22.56 kg/m   Skin Exam - Rash is suspect for a ringworm with serpiginous pink pattern.    Assessment and Plan:   1. Ringworm of body  - terbinafine (LAMISIL) 250 MG tablet; Take  1 tablet  Daily  for Ringworm  Dispense: 30 tablet; Refill: 0   Follow Up Instructions:        I discussed the assessment and treatment plan with the patient. The patient was provided an opportunity to ask questions and all were answered. The patient agreed with the plan .      The patient was advised to call back or seek an in-person evaluation if the symptoms worsen or if the condition fails to improve as anticipated.   , MD

## 2021-09-09 ENCOUNTER — Other Ambulatory Visit: Payer: Self-pay

## 2021-09-09 ENCOUNTER — Ambulatory Visit: Payer: Self-pay | Admitting: Internal Medicine

## 2021-09-09 ENCOUNTER — Encounter: Payer: Self-pay | Admitting: Internal Medicine

## 2021-09-09 VITALS — BP 130/76 | HR 85 | Temp 97.9°F | Resp 16 | Ht 66.0 in | Wt 142.6 lb

## 2021-09-09 DIAGNOSIS — B354 Tinea corporis: Secondary | ICD-10-CM

## 2021-09-09 DIAGNOSIS — F325 Major depressive disorder, single episode, in full remission: Secondary | ICD-10-CM

## 2021-09-09 MED ORDER — ITRACONAZOLE 100 MG PO CAPS: ORAL_CAPSULE | ORAL | 1 refills | Status: DC

## 2021-09-09 MED ORDER — KETOCONAZOLE 2 % EX CREA: 1.0000 "application " | TOPICAL_CREAM | Freq: Every day | CUTANEOUS | 3 refills | Status: DC

## 2021-09-09 NOTE — Progress Notes (Addendum)
° °  Future Appointments  Date Time Provider Department  09/09/2021  3:30 PM Unk Pinto, MD [facility]  10/22/2021  9:00 AM Magda Bernheim, NP [facility]    History of Present Illness:     Patient is a very nice 31 yo WM presenting with c/o  rash on trunk - treated for ringworm  approx 1 month ago with Lamisil. Reports rash better, but not completely resolved.                                              Medications    terbinafine (LAMISIL) 250 MG tablet, Take  1 tablet  Daily  for Ringworm ( completed )  Problem list He has Environmental allergies; Hyperlipidemia; Vitamin D deficiency; Major depression in remission ([place]); History of prediabetes; and Dips tobacco on their problem list.   Observations/Objective:  BP 130/76    Pulse 85    Temp 97.9 F (36.6 C)    Resp 16    Ht 5\' 6"  (1.676 m)    Wt 142 lb 9.6 oz (64.7 kg)    SpO2 96%    BMI 23.02 kg/m   Still noted classic ringworm rash about the beltline.   Assessment and Plan:  1. Ringworm of body  - Itraconazole (SPORANOX) 100 MG capsule;  Take 1 capsule Daily for skin ringworm   Dispense: 30 capsule; Refill: 1  - ketoconazole (NIZORAL) 2 % cream;  Apply 1 application topically daily. Apply to Skin  Ringworm  Daily   Dispense: 60 g; Refill: 3    Follow Up Instructions:        I discussed the assessment and treatment plan with the patient. The patient was provided an opportunity to ask questions and all were answered. The patient agreed with the plan and demonstrated an understanding of the instructions.       The patient was advised to call back or seek an in-person evaluation if the symptoms worsen or if the condition fails to improve as anticipated.    Kirtland Bouchard, MD

## 2021-09-10 ENCOUNTER — Other Ambulatory Visit: Payer: Self-pay | Admitting: Adult Health

## 2021-09-10 DIAGNOSIS — F321 Major depressive disorder, single episode, moderate: Secondary | ICD-10-CM
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
## 2021-10-22 ENCOUNTER — Encounter: Payer: Self-pay | Admitting: Nurse Practitioner

## 2021-10-22 DIAGNOSIS — Z9109 Other allergy status, other than to drugs and biological substances: Secondary | ICD-10-CM

## 2021-10-22 DIAGNOSIS — Z1389 Encounter for screening for other disorder: Secondary | ICD-10-CM

## 2021-10-22 DIAGNOSIS — Z720 Tobacco use: Secondary | ICD-10-CM

## 2021-10-22 DIAGNOSIS — E559 Vitamin D deficiency, unspecified: Secondary | ICD-10-CM

## 2021-10-22 DIAGNOSIS — Z0001 Encounter for general adult medical examination with abnormal findings: Secondary | ICD-10-CM

## 2021-10-22 DIAGNOSIS — Z79899 Other long term (current) drug therapy: Secondary | ICD-10-CM

## 2021-10-22 DIAGNOSIS — R7309 Other abnormal glucose: Secondary | ICD-10-CM

## 2021-10-22 DIAGNOSIS — Z1329 Encounter for screening for other suspected endocrine disorder: Secondary | ICD-10-CM

## 2021-10-22 DIAGNOSIS — F321 Major depressive disorder, single episode, moderate: Secondary | ICD-10-CM

## 2021-10-22 DIAGNOSIS — E785 Hyperlipidemia, unspecified: Secondary | ICD-10-CM
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
## 2021-11-11 ENCOUNTER — Encounter: Payer: Self-pay | Admitting: Nurse Practitioner

## 2021-11-11 DIAGNOSIS — Z1329 Encounter for screening for other suspected endocrine disorder: Secondary | ICD-10-CM

## 2021-11-11 DIAGNOSIS — Z720 Tobacco use: Secondary | ICD-10-CM

## 2021-11-11 DIAGNOSIS — Z9109 Other allergy status, other than to drugs and biological substances: Secondary | ICD-10-CM

## 2021-11-11 DIAGNOSIS — Z79899 Other long term (current) drug therapy: Secondary | ICD-10-CM

## 2021-11-11 DIAGNOSIS — E785 Hyperlipidemia, unspecified: Secondary | ICD-10-CM

## 2021-11-11 DIAGNOSIS — R7309 Other abnormal glucose: Secondary | ICD-10-CM

## 2021-11-11 DIAGNOSIS — E559 Vitamin D deficiency, unspecified: Secondary | ICD-10-CM

## 2021-11-11 DIAGNOSIS — Z1389 Encounter for screening for other disorder: Secondary | ICD-10-CM

## 2021-11-11 DIAGNOSIS — F325 Major depressive disorder, single episode, in full remission: Secondary | ICD-10-CM

## 2021-11-11 DIAGNOSIS — Z0000 Encounter for general adult medical examination without abnormal findings: Secondary | ICD-10-CM

## 2021-11-11 DIAGNOSIS — F321 Major depressive disorder, single episode, moderate: Secondary | ICD-10-CM

## 2022-05-24 ENCOUNTER — Encounter: Payer: Self-pay | Admitting: Adult Health

## 2022-06-03 ENCOUNTER — Ambulatory Visit (INDEPENDENT_AMBULATORY_CARE_PROVIDER_SITE_OTHER): Payer: Self-pay | Admitting: Nurse Practitioner

## 2022-06-03 ENCOUNTER — Encounter: Payer: Self-pay | Admitting: Nurse Practitioner

## 2022-06-03 VITALS — BP 118/64 | HR 77 | Temp 97.7°F | Ht 66.0 in | Wt 144.0 lb

## 2022-06-03 DIAGNOSIS — L299 Pruritus, unspecified: Secondary | ICD-10-CM

## 2022-06-03 DIAGNOSIS — L2082 Flexural eczema: Secondary | ICD-10-CM

## 2022-06-03 MED ORDER — TRIAMCINOLONE ACETONIDE 0.025 % EX OINT: 1.0000 | TOPICAL_OINTMENT | Freq: Two times a day (BID) | CUTANEOUS | 0 refills | Status: DC

## 2022-06-03 MED ORDER — DEXAMETHASONE SODIUM PHOSPHATE 10 MG/ML IJ SOLN
10.0000 mg | Freq: Once | INTRAMUSCULAR | Status: AC
  Administered 2022-06-03: 10 mg via INTRAMUSCULAR
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
## 2022-06-03 NOTE — Patient Instructions (Signed)
Atopic Dermatitis ?Atopic dermatitis is a skin disorder that causes inflammation of the skin. It is marked by a red rash and itchy, dry, scaly skin. It is the most common type of eczema. Eczema is a group of skin conditions that cause the skin to become rough and swollen. This condition is generally worse during the cooler winter months and often improves during the warm summer months. ?Atopic dermatitis usually starts showing signs in infancy and can last through adulthood. This condition cannot be passed from one person to another (is not contagious). Atopic dermatitis may not always be present, but when it is, it is called a flare-up. ?What are the causes? ?The exact cause of this condition is not known. Flare-ups may be triggered by: ?Coming in contact with something that you are sensitive or allergic to (allergen). ?Stress. ?Certain foods. ?Extremely hot or cold weather. ?Harsh chemicals and soaps. ?Dry air. ?Chlorine. ?What increases the risk? ?This condition is more likely to develop in people who have a personal or family history of: ?Eczema. ?Allergies. ?Asthma. ?Hay fever. ?What are the signs or symptoms? ?Symptoms of this condition include: ?Dry, scaly skin. ?Red, itchy rash. ?Itchiness, which can be severe. This may occur before the skin rash. This can make sleeping difficult. ?Skin thickening and cracking that can occur over time. ?How is this diagnosed? ?This condition is diagnosed based on: ?Your symptoms. ?Your medical history. ?A physical exam. ?How is this treated? ?There is no cure for this condition, but symptoms can usually be controlled. Treatment focuses on: ?Controlling the itchiness and scratching. You may be given medicines, such as antihistamines or steroid creams. ?Limiting exposure to allergens. ?Recognizing situations that cause stress and developing a plan to manage stress. ?If your atopic dermatitis does not get better with medicines, or if it is all over your body (widespread), a  treatment using a specific type of light (phototherapy) may be used. ?Follow these instructions at home: ?Skin care ? ?Keep your skin well moisturized. Doing this seals in moisture and helps to prevent dryness. ?Use unscented lotions that have petroleum in them. ?Avoid lotions that contain alcohol or water. They can dry the skin. ?Keep baths or showers short (less than 5 minutes) in warm water. Do not use hot water. ?Use mild, unscented cleansers for bathing. Avoid soap and bubble bath. ?Apply a moisturizer to your skin right after a bath or shower. ?Do not apply anything to your skin without checking with your health care provider. ?General instructions ?Take or apply over-the-counter and prescription medicines only as told by your health care provider. ?Dress in clothes made of cotton or cotton blends. Dress lightly because heat increases itchiness. ?When washing your clothes, rinse your clothes twice so all of the soap is removed. ?Avoid any triggers that can cause a flare-up. ?Keep your fingernails cut short. ?Avoid scratching. Scratching makes the rash and itchiness worse. A break in the skin from scratching could result in a skin infection (impetigo). ?Do not be around people who have cold sores or fever blisters. If you get the infection, it may cause your atopic dermatitis to worsen. ?Keep all follow-up visits. This is important. ?Contact a health care provider if: ?Your itchiness interferes with sleep. ?Your rash gets worse or is not better within one week of starting treatment. ?You have a fever. ?You have a rash flare-up after having contact with someone who has cold sores or fever blisters. ?Get help right away if: ?You develop pus or soft yellow scabs in the rash   area. ?Summary ?Atopic dermatitis causes a red rash and itchy, dry, scaly skin. ?Treatment focuses on controlling the itchiness and scratching, limiting exposure to things that you are sensitive or allergic to (allergens), recognizing  situations that cause stress, and developing a plan to manage stress. ?Keep your skin well moisturized. ?Keep baths or showers shorter than 5 minutes and use warm water. Do not use hot water. ?This information is not intended to replace advice given to you by your health care provider. Make sure you discuss any questions you have with your health care provider. ?Document Revised: 05/25/2020 Document Reviewed: 05/25/2020 ?Elsevier Patient Education ? 2023 [employer]vier Inc[employer] ? ?

## 2022-06-23 ENCOUNTER — Ambulatory Visit (INDEPENDENT_AMBULATORY_CARE_PROVIDER_SITE_OTHER): Payer: Self-pay | Admitting: Nurse Practitioner

## 2022-06-23 VITALS — BP 128/78 | HR 87 | Temp 97.3°F | Ht 66.0 in | Wt 144.0 lb

## 2022-06-23 DIAGNOSIS — Z7251 High risk heterosexual behavior: Secondary | ICD-10-CM
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
## 2022-06-24 LAB — HIV ANTIBODY (ROUTINE TESTING W REFLEX): HIV 1&2 Ab, 4th Generation: NONREACTIVE

## 2022-06-24 LAB — HSV(HERPES SIMPLEX VRS) I + II AB-IGG
HAV 1 IGG,TYPE SPECIFIC AB: <0.9 index
HSV 2 IGG,TYPE SPECIFIC AB: <0.9 index

## 2022-06-24 LAB — C. TRACHOMATIS/N. GONORRHOEAE RNA
C. trachomatis RNA, TMA: NOT DETECTED
N. gonorrhoeae RNA, TMA: NOT DETECTED

## 2022-06-24 LAB — RPR: RPR Ser Ql: NONREACTIVE

## 2022-06-24 LAB — HEPATITIS C ANTIBODY: Hepatitis C Ab: NONREACTIVE

## 2022-07-04 ENCOUNTER — Ambulatory Visit (INDEPENDENT_AMBULATORY_CARE_PROVIDER_SITE_OTHER): Payer: Self-pay | Admitting: Nurse Practitioner

## 2022-07-04 ENCOUNTER — Encounter: Payer: Self-pay | Admitting: Nurse Practitioner

## 2022-07-04 VITALS — BP 118/72 | HR 63 | Temp 97.7°F | Ht 67.0 in | Wt 145.6 lb

## 2022-07-04 DIAGNOSIS — Z87898 Personal history of other specified conditions: Secondary | ICD-10-CM

## 2022-07-04 DIAGNOSIS — E559 Vitamin D deficiency, unspecified: Secondary | ICD-10-CM

## 2022-07-04 DIAGNOSIS — G8929 Other chronic pain: Secondary | ICD-10-CM

## 2022-07-04 DIAGNOSIS — Z0001 Encounter for general adult medical examination with abnormal findings: Secondary | ICD-10-CM

## 2022-07-04 DIAGNOSIS — Z0000 Encounter for general adult medical examination without abnormal findings: Secondary | ICD-10-CM

## 2022-07-04 DIAGNOSIS — Z9109 Other allergy status, other than to drugs and biological substances: Secondary | ICD-10-CM

## 2022-07-04 DIAGNOSIS — Z136 Encounter for screening for cardiovascular disorders: Secondary | ICD-10-CM

## 2022-07-04 DIAGNOSIS — Z79899 Other long term (current) drug therapy: Secondary | ICD-10-CM

## 2022-07-04 DIAGNOSIS — E785 Hyperlipidemia, unspecified: Secondary | ICD-10-CM

## 2022-07-04 DIAGNOSIS — F325 Major depressive disorder, single episode, in full remission: Secondary | ICD-10-CM

## 2022-07-04 DIAGNOSIS — Z8249 Family history of ischemic heart disease and other diseases of the circulatory system: Secondary | ICD-10-CM

## 2022-07-04 LAB — COMPLETE METABOLIC PANEL WITH GFR
AG Ratio: 2 (calc) (ref 1.0–2.5)
ALT: 13 U/L (ref 9–46)
AST: 14 U/L (ref 10–40)
Albumin: 4.7 g/dL (ref 3.6–5.1)
Alkaline phosphatase (APISO): 62 U/L (ref 36–130)
BUN: 17 mg/dL (ref 7–25)
CO2: 31 mmol/L (ref 20–32)
Calcium: 9.7 mg/dL (ref 8.6–10.3)
Chloride: 103 mmol/L (ref 98–110)
Creat: 0.91 mg/dL (ref 0.60–1.26)
Globulin: 2.3 g/dL (calc) (ref 1.9–3.7)
Glucose, Bld: 76 mg/dL (ref 65–99)
Potassium: 4.8 mmol/L (ref 3.5–5.3)
Sodium: 141 mmol/L (ref 135–146)
Total Bilirubin: 0.4 mg/dL (ref 0.2–1.2)
Total Protein: 7 g/dL (ref 6.1–8.1)
eGFR: 116 mL/min/{1.73_m2} (ref >=60)

## 2022-07-04 LAB — CBC WITH DIFFERENTIAL/PLATELET
Absolute Monocytes: 507 cells/uL (ref 200–950)
Basophils Absolute: 41 cells/uL (ref 0–200)
Basophils Relative: 0.7 %
Eosinophils Absolute: 148 cells/uL (ref 15–500)
Eosinophils Relative: 2.5 %
HCT: 43.5 % (ref 38.5–50.0)
Hemoglobin: 15 g/dL (ref 13.2–17.1)
Lymphs Abs: 1959 cells/uL (ref 850–3900)
MCH: 31.4 pg (ref 27.0–33.0)
MCHC: 34.5 g/dL (ref 32.0–36.0)
MCV: 91.2 fL (ref 80.0–100.0)
MPV: 9.3 fL (ref 7.5–12.5)
Monocytes Relative: 8.6 %
Neutro Abs: 3245 cells/uL (ref 1500–7800)
Neutrophils Relative %: 55 %
Platelets: 312 10*3/uL (ref 140–400)
RBC: 4.77 10*6/uL (ref 4.20–5.80)
RDW: 12.9 % (ref 11.0–15.0)
Total Lymphocyte: 33.2 %
WBC: 5.9 10*3/uL (ref 3.8–10.8)

## 2022-07-04 LAB — LIPID PANEL
Cholesterol: 238 mg/dL — ABNORMAL HIGH (ref <200)
HDL: 53 mg/dL (ref >=40)
LDL Cholesterol (Calc): 159 mg/dL (calc) — ABNORMAL HIGH
Non-HDL Cholesterol (Calc): 185 mg/dL (calc) — ABNORMAL HIGH (ref <130)
Total CHOL/HDL Ratio: 4.5 (calc) (ref <5.0)
Triglycerides: 139 mg/dL (ref <150)
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
## 2022-07-04 NOTE — Patient Instructions (Signed)
RICE Therapy for Routine Care of Injuries Many injuries can be cared for with rest, ice, compression, and elevation (RICE therapy). This includes: Resting the injured body part. Putting ice on the injury. Putting pressure (compression) on the injury. Raising the injured part (elevation). Using RICE therapy can help to lessen pain and swelling. Supplies needed: Ice. Plastic bag. Towel. Elastic bandage. Pillow or pillows to raise your injured body part. How to care for your injury with RICE therapy Rest Try to rest the injured part of your body. You can go back to your normal activities when your doctor says it is okay to do them and when you can do them without pain. If you rest the injury too much, it may not heal as well. Some injuries heal better with early movement instead of resting for too long. Ask your doctor if you should do exercises to help your injury get better. Ice  If told, put ice on the injured area. To do this: Put ice in a plastic bag. Place a towel between your skin and the bag. Leave the ice on for 20 minutes, 2-3 times a day. Take off the ice if your skin turns bright red. This is very important. If you cannot feel pain, heat, or cold, you have a greater risk of damage to the area. Do not put ice on your bare skin. Use ice for as many days as your doctor tells you to use it. Compression Put pressure on the injured area. This can be done with an elastic bandage. If this type of bandage has been put on your injury: Follow instructions on the package the bandage came in about how to use it. Do not wrap the bandage too tightly. Wrap the bandage more loosely if part of your body beyond the bandage is blue, swollen, cold, painful, or loses feeling. Take off the bandage and put it on again every 3-4 hours or as told by your doctor. See your doctor if the bandage seems to make your problems worse.  Elevation Raise the injured area above the level of your heart while you  are sitting or lying down. Follow these instructions at home: If your symptoms get worse or last a long time, make a follow-up appointment with your doctor. You may need to have imaging tests, such as X-rays or an MRI. If you have imaging tests, ask how to get your results when they are ready. Return to your normal activities when your doctor says that it is safe. Keep all follow-up visits. Contact a doctor if: You keep having pain and swelling. Your symptoms get worse. Get help right away if: You have sudden, very bad pain at your injury or lower than your injury. You have redness or more swelling around your injury. You have tingling or numbness at your injury or lower than your injury, and it does not go away when you take off the bandage. Summary Many injuries can be cared for using rest, ice, compression, and elevation (RICE therapy). You can go back to your normal activities when your doctor says it is okay and when you can do them without pain. Put ice on the injured area as told by your doctor. Get help if your symptoms get worse or if you keep having pain and swelling. This information is not intended to replace advice given to you by your health care provider. Make sure you discuss any questions you have with your health care provider. Document Revised: 06/04/2020 Document Reviewed: 06/04/2020   Elsevier Patient Education  2023 Elsevier Inc.  

## 2022-07-08 ENCOUNTER — Other Ambulatory Visit: Payer: Self-pay | Admitting: Nurse Practitioner

## 2022-07-08 MED ORDER — ATORVASTATIN CALCIUM 10 MG PO TABS: 10.0000 mg | ORAL_TABLET | Freq: Every day | ORAL | 11 refills | Status: DC
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
## 2022-08-25 ENCOUNTER — Ambulatory Visit: Payer: Self-pay | Admitting: Nurse Practitioner

## 2022-09-02 ENCOUNTER — Ambulatory Visit: Payer: BC Managed Care – PPO | Admitting: Nurse Practitioner

## 2022-09-02 ENCOUNTER — Other Ambulatory Visit: Payer: Self-pay

## 2022-09-02 ENCOUNTER — Encounter: Payer: Self-pay | Admitting: Nurse Practitioner

## 2022-09-02 VITALS — BP 126/82 | HR 110 | Temp 98.1°F | Wt 151.0 lb

## 2022-09-02 DIAGNOSIS — Z91018 Allergy to other foods: Secondary | ICD-10-CM

## 2022-09-02 DIAGNOSIS — Z1152 Encounter for screening for COVID-19: Secondary | ICD-10-CM

## 2022-09-02 DIAGNOSIS — L299 Pruritus, unspecified: Secondary | ICD-10-CM

## 2022-09-02 DIAGNOSIS — R6889 Other general symptoms and signs: Secondary | ICD-10-CM

## 2022-09-02 DIAGNOSIS — L509 Urticaria, unspecified: Secondary | ICD-10-CM | POA: Diagnosis not present

## 2022-09-02 LAB — POCT INFLUENZA A/B
Influenza A, POC: NEGATIVE
Influenza B, POC: NEGATIVE

## 2022-09-02 LAB — POC COVID19 BINAXNOW: SARS Coronavirus 2 Ag: NEGATIVE

## 2022-09-02 MED ORDER — EPINEPHRINE 0.3 MG/0.3ML IJ SOAJ: 0.3000 mg | INTRAMUSCULAR | 2 refills | Status: AC | PRN

## 2022-09-02 MED ORDER — PREDNISONE 20 MG PO TABS: ORAL_TABLET | ORAL | 0 refills | Status: DC
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
## 2022-09-02 NOTE — Patient Instructions (Addendum)
Alpha-gal Syndrome Alpha-gal syndrome (AGS) is an allergic reaction to a type of sugar commonly called alpha-gal. It is found in the meat and organ meats of mammals, such as cows, pigs, and sheep. It may also be found in products that come from animals, such as gelatin, medicines, medicine capsules, some milk products, vaccines, and cosmetics. AGS causes an allergic reaction that can be immediate or delayed for several hours and can range from mild to severe. A mild reaction may cause nausea, vomiting, or an itchy rash (hives). A severe reaction can cause breathing difficulties or loss of consciousness (anaphylaxis). This can be life-threatening. What are the causes? This allergy is first triggered by a tick bite from a lone star or blackleg tick. These ticks bite animals, such as cows, pigs, or sheep, and pick up the alpha-gal sugar from their blood. If the same tick bites you, it may cause your body's defense system (immune system) to produce antibodies to alpha-gal and cause the allergic reaction. What increases the risk? People who live in areas of the [place] where the lone star tick is common are at highest risk, these areas may include: [place]. [place]. [place] into parts of [place]. People who are hunters and people who work in jobs that involve caring for trees (foresters) have an increased risk of this condition. What are the signs or symptoms? AGS may not cause an allergic reaction every time you eat red meat or come into contact with alpha-gal. If you do have a reaction, symptoms may include: Hives. Severe stomachache. Nausea or vomiting. Swelling of the lips, face, tongue, or throat. Making a high-pitched whistling sound when you breathe, most often when you breathe out (wheezing). Sneezing and runny nose. Headache. Symptoms of anaphylaxis may include: Difficulty breathing. Difficulty swallowing. Dizziness. Fainting. This may happen due to a sudden drop in  blood pressure. You may have AGS if you had anaphylaxis after eating something but do not have any known food allergies. Unlike other food allergies, the reaction does not start soon after the exposure to alpha-gal. There may be a delay of several hours. How is this diagnosed? This condition may be diagnosed based on signs and symptoms of the condition, especially if you have a history of tick bites and a delayed reaction to red meat. You may also have a blood test to check for antibodies to alpha-gal or a skin test to see if there is a reaction to alpha-gal. How is this treated? This condition may be treated by: Avoiding meat and organ meats that may contain alpha-gal. Avoiding medicines or other products that may contain alpha-gal. Using medicines to reduce an allergic reaction. Carrying an epinephrine auto-injector to use in case of a severe AGS reaction. AGS should be treated by an allergist or a health care provider who has experience with AGS. Follow these instructions at home:  Medicines Take over-the-counter and prescription medicines only as told by your health care provider. Follow instructions from your health care provider about when and how to use an epinephrine auto-injector. General instructions Avoid red meat and organ meat, and check food labels for meat-based ingredients in packaged foods such as soup, gravy, and flavoring. Work with your allergist to find what other foods or products you may need to avoid, some people may benefit from avoiding dairy. Keep all follow-up visits. This is important. How is this prevented? Take steps to prevent tick bites as frequent tick bites may increase the risk of an AGS reaction. These steps   include: Avoiding woods and fields with high grass. Wearing clothing protected with the anti-tick chemical permethrin when outdoors in these areas or using a U.S. Environmental Protection Agency-approved insect repellent. Checking your clothing for  ticks before you come back indoors. Checking your body for ticks when you take a shower. Checking your pets for ticks. Where to find more information Centers for Disease Control and Prevention: http://www.wolf.info/ National Institute of Allergy and Infectious Diseases: https://www.jennings-kim.com/ Contact a health care provider if: You have any signs or symptoms of AGS food allergy. You have a tick bite that causes a skin reaction. Get help right away if: You have a severe AGS reaction. You have trouble swallowing or breathing. These symptoms may represent a serious problem that is an emergency. Do not wait to see if the symptoms will go away. Get medical help right away. Call your local emergency services (911 in the [place] Do not drive yourself to the hospital. Summary AGS is a food allergy caused by a tick bite. Red meats, organ meats, and other products or medicines may trigger the alpha-gal reaction. AGS reactions can be mild or severe. If you have AGS, you should not eat red meat, organ meat, or products that may contain alpha-gal. Avoiding tick bites prevents AGS and more serious AGS reactions. This information is not intended to replace advice given to you by your health care provider. Make sure you discuss any questions you have with your health care provider. Document Revised: 12/01/2020 Document Reviewed: 12/01/2020 Elsevier Patient Education  [place].  How to Use an Auto-Injector Pen An auto-injector pen (pre-filled automatic epinephrine injection device) is a device that is used to deliver epinephrine to the body. Epinephrine is a medicine that is given as a shot (injection). It works by relaxing the muscles in the airways and tightening the blood vessels. It is used to treat: A life-threatening allergic reaction (anaphylaxis). Serious breathing problems, such as severe asthma attacks, some lung problems, and other emergency conditions. An epinephrine injection can save your life. You  should always carry an auto-injector pen with you if you are at risk for severe asthma attacks or anaphylaxis. You may hear other names for an auto-injector pen. They are epinephrine injection, epinephrine auto-injector pen, epinephrine pen, and automatic injection device. What are the risks? Using the auto-injector pen is safe. However, problems may arise, including: Damage to bone or tissue. Make sure that you correctly place the needle in the muscle of your outer thigh as told by your health care provider. When should I use my auto-injector pen? Use your auto-injector pen as soon as you think you are experiencing anaphylaxis or a severe asthma attack. Anaphylaxis is very dangerous if it is not treated right away. Signs and symptoms of anaphylaxis may include: Feeling warm in the face (flushed). This may include redness. Itchy, red, swollen areas of skin (hives). Swelling of the eyes, lips, face, mouth, tongue, or throat. Difficulty breathing, speaking, or swallowing. Noisy breathing (wheezing). Dizziness, light-headedness, or fainting. Pain or cramping in the abdomen. Vomiting or diarrhea. These symptoms may represent a serious problem that is an emergency. Do not wait to see if the symptoms will go away. Use your auto-injector pen as you have been instructed, and get medical help right away. Call your local emergency services (911 in the [place] Do not drive yourself to the hospital. General tips for using an auto-injector pen  Use epinephrine exactly as told by your health care provider. Do not inject it more  often or in greater or smaller doses than your health care provider told you. Most auto-injector pens contain one dose of epinephrine. Some contain two doses. Sit or lie down before giving yourself an injection or before receiving an injection from someone else. Use the auto-injector pen to give yourself an injection under your skin or into your muscle on the outer side of your thigh. Do  not give yourself an injection into your buttocks or any other part of your body. In an emergency, you can use your auto-injector pen through your clothing. After you inject a dose of epinephrine, some liquid may remain in your auto-injector pen. This is normal. Replace your epinephrine immediately after you use your auto-injector pen. This is important if you have another reaction. If possible, carry two epinephrine auto-injector pens. If you need to give yourself a second dose of epinephrine, give the second injection in another area on your outer thigh. Do not give two injections in exactly the same place on your body. This can lead to tissue damage. From time to time: Check the expiration date on your auto-injector pen. Check the solution to ensure that it is not cloudy and that there are no particles floating in it. If your auto-injector pen is expired or if the solution is cloudy or has particles floating in it, throw it away and get a new one. Ask your health care provider how to safely get rid of used or expired auto-injector pens. Talk with your pharmacist or health care provider if you have questions about how to inject epinephrine correctly. Get help right away if: You inject epinephrine. If you use epinephrine, you must still get emergency medical treatment, even if the medicine seems to be working. You may need additional medical care, and you may need to be monitored for the side effects of epinephrine. The side effects include: Fast or irregular heartbeat. Nervousness or anxiety with shaking that does not stop. Difficulty breathing. Sweating. Headache. Nausea or vomiting. Dizziness or weakness. Summary An auto-injector pen (pre-filled automatic epinephrine injection device) is a device that is used to deliver epinephrine to the body. An auto-injector pen is used to treat a life-threatening allergic reaction (anaphylaxis), asthma attack, or other emergency conditions. You should  always carry an auto-injector pen with you if you are at risk for anaphylaxis or severe asthma attacks. Use of this device is safe. However, bone or tissue damage can occur if you do not follow instructions for injecting the medicine. Talk with your pharmacist or health care provider if you have questions about how to inject epinephrine correctly. This information is not intended to replace advice given to you by your health care provider. Make sure you discuss any questions you have with your health care provider. Document Revised: 12/29/2021 Document Reviewed: 10/02/2020 Elsevier Patient Education  [place].

## 2022-09-05 ENCOUNTER — Telehealth: Payer: Self-pay

## 2022-09-05 LAB — INTERPRETATION:

## 2022-09-05 LAB — ALPHA-GAL PANEL
Allergen, Mutton, f88: <0.1 kU/L
Allergen, Pork, f26: <0.1 kU/L
Beef: <0.1 kU/L
CLASS: 0
CLASS: 0
Class: 0
GALACTOSE-ALPHA-1,3-GALACTOSE IGE*: <0.1 kU/L (ref <0.10)

## 2022-09-05 LAB — ALLERGEN, BEEF IGG: Beef, IgG: 8.7 ug/mL — ABNORMAL HIGH (ref <2.0)

## 2022-09-05 LAB — ALLERGEN, PORK IGG: Pork IgG: 3.4 ug/mL — ABNORMAL HIGH (ref <2.0)

## 2022-09-05 LAB — ALLERGEN, CHICKEN IGG: CHICKEN MEAT (F83) IGG: <2 ug/mL (ref <2.0)

## 2022-09-05 NOTE — Telephone Encounter (Signed)
Patient aware that results are not back yet. No additional outbreaks over the weekend.

## 2022-09-05 NOTE — Telephone Encounter (Signed)
-----   Message from Darrol Jump, NP sent at 09/05/2022  9:09 AM EST ----- Regarding: Update Please let him know that test results have not yet returned.  Reach out to see if he had any additional outbreaks over the weekend.  Thanks

## 2022-10-11 ENCOUNTER — Encounter: Payer: Self-pay | Admitting: Nurse Practitioner

## 2022-10-11 ENCOUNTER — Ambulatory Visit: Payer: BC Managed Care – PPO | Admitting: Nurse Practitioner

## 2022-10-11 VITALS — BP 110/70 | HR 68 | Temp 97.9°F | Ht 66.0 in | Wt 149.0 lb

## 2022-10-11 DIAGNOSIS — Z79899 Other long term (current) drug therapy: Secondary | ICD-10-CM

## 2022-10-11 DIAGNOSIS — Z91018 Allergy to other foods: Secondary | ICD-10-CM

## 2022-10-11 DIAGNOSIS — E785 Hyperlipidemia, unspecified: Secondary | ICD-10-CM | POA: Diagnosis not present

## 2022-10-11 DIAGNOSIS — F325 Major depressive disorder, single episode, in full remission: Secondary | ICD-10-CM

## 2022-10-11 DIAGNOSIS — Z720 Tobacco use: Secondary | ICD-10-CM

## 2022-10-11 DIAGNOSIS — Z9109 Other allergy status, other than to drugs and biological substances: Secondary | ICD-10-CM
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
## 2022-10-11 NOTE — Patient Instructions (Signed)
Allergic Rhinitis, Adult Allergic rhinitis is a reaction to allergens. Allergens are things that can cause an allergic reaction. This condition affects the lining inside the nose (mucous membrane). There are two types of allergic rhinitis: Seasonal. This type is also called hay fever. It happens only during some times of the year. Perennial. This type can happen at any time of the year. This condition cannot be spread from person to person (is not contagious). It can be mild, worse, or very bad. It can develop at any age and may be outgrown. What are the causes? This condition may be caused by: Pollen from grasses, trees, and weeds. Dust mites. Smoke. Mold. Car fumes. The pee (urine), spit, or dander of pets. Dander is dead skin cells from a pet. What increases the risk? You are more likely to develop this condition if: You have allergies in your family. You have problems like allergies in your family. You may have: Swelling of parts of your eyes and eyelids. Asthma. This affects how you breathe. Long-term redness and swelling on your skin. Food allergies. What are the signs or symptoms? The main symptom of this condition is a runny or stuffy nose (nasal congestion). Other symptoms may include: Sneezing or coughing. Itching and tearing of your eyes. Mucus that drips down the back of your throat (postnasal drip). Trouble sleeping. Feeling tired. Headache. Sore throat. How is this treated? There is no cure for this condition. You should avoid things that you are allergic to. Treatment can help to relieve symptoms. This may include: Medicines that block allergy symptoms, such as corticosteroids or antihistamines. These may be given as a shot, nasal spray, or pill. Avoiding things you are allergic to. Medicines that give you bits of what you are allergic to over time. This is called immunotherapy. It is done if other treatments do not help. You may get: Shots. Medicine under your  tongue. Stronger medicines, if other treatments do not help. Follow these instructions at home: Avoiding allergens Find out what things you are allergic to and avoid them. To do this, try these things: If you get allergies any time of year: Replace carpet with wood, tile, or vinyl flooring. Carpet can trap pet dander and dust. Do not smoke. Do not allow smoking in your home. Change your heating and air conditioning filters at least once a month. If you get allergies only some times of the year: Keep windows closed when you can. Plan things to do outside when pollen counts are lowest. Check pollen counts before you plan things to do outside. When you come indoors, change your clothes and shower before you sit on furniture or bedding. If you are allergic to a pet: Keep the pet out of your bedroom. Vacuum, sweep, and dust often. General instructions Take over-the-counter and prescription medicines only as told by your doctor. Drink enough fluid to keep your pee (urine) pale yellow. Keep all follow-up visits as told by your doctor. This is important. Where to find more information American Academy of Allergy, Asthma & Immunology: www.aaaai.org Contact a doctor if: You have a fever. You get a cough that does not go away. You make whistling sounds when you breathe (wheeze). Your symptoms slow you down. Your symptoms stop you from doing your normal things each day. Get help right away if: You are short of breath. This symptom may be an emergency. Do not wait to see if the symptom will go away. Get medical help right away. Call your local emergency  services (911 in the [place] Do not drive yourself to the hospital. Summary Allergic rhinitis may be treated by taking medicines and avoiding things you are allergic to. If you have allergies only some of the year, keep windows closed when you can at those times. Contact your doctor if you get a fever or a cough that does not go away. This  information is not intended to replace advice given to you by your health care provider. Make sure you discuss any questions you have with your health care provider. Document Revised: 01/27/2022 Document Reviewed: 08/13/2019 Elsevier Patient Education  [place].

## 2022-10-12 ENCOUNTER — Other Ambulatory Visit: Payer: Self-pay | Admitting: Nurse Practitioner

## 2022-10-12 DIAGNOSIS — R7989 Other specified abnormal findings of blood chemistry: Secondary | ICD-10-CM

## 2022-10-12 LAB — CBC WITH DIFFERENTIAL/PLATELET
Absolute Monocytes: 576 cells/uL (ref 200–950)
Basophils Absolute: 27 cells/uL (ref 0–200)
Basophils Relative: 0.4 %
Eosinophils Absolute: 87 cells/uL (ref 15–500)
Eosinophils Relative: 1.3 %
HCT: 40.9 % (ref 38.5–50.0)
Hemoglobin: 14.4 g/dL (ref 13.2–17.1)
Lymphs Abs: 2070 cells/uL (ref 850–3900)
MCH: 31.2 pg (ref 27.0–33.0)
MCHC: 35.2 g/dL (ref 32.0–36.0)
MCV: 88.7 fL (ref 80.0–100.0)
MPV: 9.5 fL (ref 7.5–12.5)
Monocytes Relative: 8.6 %
Neutro Abs: 3940 cells/uL (ref 1500–7800)
Neutrophils Relative %: 58.8 %
Platelets: 317 10*3/uL (ref 140–400)
RBC: 4.61 10*6/uL (ref 4.20–5.80)
RDW: 13.3 % (ref 11.0–15.0)
Total Lymphocyte: 30.9 %
WBC: 6.7 10*3/uL (ref 3.8–10.8)

## 2022-10-12 LAB — COMPLETE METABOLIC PANEL WITH GFR
AG Ratio: 2.2 (calc) (ref 1.0–2.5)
ALT: 90 U/L — ABNORMAL HIGH (ref 9–46)
AST: 117 U/L — ABNORMAL HIGH (ref 10–40)
Albumin: 4.8 g/dL (ref 3.6–5.1)
Alkaline phosphatase (APISO): 54 U/L (ref 36–130)
BUN: 16 mg/dL (ref 7–25)
CO2: 26 mmol/L (ref 20–32)
Calcium: 9.5 mg/dL (ref 8.6–10.3)
Chloride: 102 mmol/L (ref 98–110)
Creat: 1.06 mg/dL (ref 0.60–1.26)
Globulin: 2.2 g/dL (calc) (ref 1.9–3.7)
Glucose, Bld: 81 mg/dL (ref 65–99)
Potassium: 3.9 mmol/L (ref 3.5–5.3)
Sodium: 139 mmol/L (ref 135–146)
Total Bilirubin: 0.6 mg/dL (ref 0.2–1.2)
Total Protein: 7 g/dL (ref 6.1–8.1)
eGFR: 96 mL/min/{1.73_m2} (ref >=60)

## 2022-10-12 LAB — LIPID PANEL
Cholesterol: 237 mg/dL — ABNORMAL HIGH (ref <200)
HDL: 58 mg/dL (ref >=40)
LDL Cholesterol (Calc): 160 mg/dL (calc) — ABNORMAL HIGH
Non-HDL Cholesterol (Calc): 179 mg/dL (calc) — ABNORMAL HIGH (ref <130)
Total CHOL/HDL Ratio: 4.1 (calc) (ref <5.0)
Triglycerides: 87 mg/dL (ref <150)

## 2022-10-24 ENCOUNTER — Encounter: Payer: BC Managed Care – PPO | Admitting: Nurse Practitioner

## 2022-11-09 ENCOUNTER — Other Ambulatory Visit: Payer: BC Managed Care – PPO

## 2022-11-14 ENCOUNTER — Encounter: Payer: BC Managed Care – PPO | Admitting: Nurse Practitioner

## 2023-01-19 ENCOUNTER — Ambulatory Visit: Payer: BC Managed Care – PPO | Admitting: Nurse Practitioner

## 2023-01-26 ENCOUNTER — Encounter: Payer: Self-pay | Admitting: Nurse Practitioner

## 2023-01-26 ENCOUNTER — Ambulatory Visit (INDEPENDENT_AMBULATORY_CARE_PROVIDER_SITE_OTHER): Payer: BC Managed Care – PPO | Admitting: Nurse Practitioner

## 2023-01-26 VITALS — BP 120/72 | HR 94 | Temp 98.0°F | Ht 66.0 in | Wt 144.6 lb

## 2023-01-26 DIAGNOSIS — F418 Other specified anxiety disorders: Secondary | ICD-10-CM | POA: Diagnosis not present

## 2023-01-26 DIAGNOSIS — S6992XS Unspecified injury of left wrist, hand and finger(s), sequela: Secondary | ICD-10-CM

## 2023-01-26 DIAGNOSIS — M79645 Pain in left finger(s): Secondary | ICD-10-CM | POA: Diagnosis not present

## 2023-01-26 DIAGNOSIS — Z720 Tobacco use: Secondary | ICD-10-CM | POA: Diagnosis not present

## 2023-01-26 DIAGNOSIS — Z9109 Other allergy status, other than to drugs and biological substances: Secondary | ICD-10-CM

## 2023-01-26 DIAGNOSIS — E782 Mixed hyperlipidemia: Secondary | ICD-10-CM

## 2023-01-26 DIAGNOSIS — Z79899 Other long term (current) drug therapy: Secondary | ICD-10-CM

## 2023-01-26 DIAGNOSIS — R7989 Other specified abnormal findings of blood chemistry: Secondary | ICD-10-CM

## 2023-01-26 DIAGNOSIS — G629 Polyneuropathy, unspecified: Secondary | ICD-10-CM
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
## 2023-01-26 NOTE — Patient Instructions (Signed)
Paresthesia Paresthesia is a burning or prickling feeling. This feeling can happen in any part of the body. It often happens in the hands, arms, legs, or feet. Usually, it is not painful. In most cases, the feeling goes away in a short time and is not a sign of a serious problem. If you have paresthesia that lasts a long time, you need to see your doctor. Follow these instructions at home: Nutrition Eat a healthy diet. This includes: Eating foods that are high in fiber. These include beans, whole grains, and fresh fruits and vegetables. Limiting foods that are high in fat and sugar. These include fried or sweet foods.  Alcohol use  Do not drink alcohol if: Your doctor tells you not to drink. You are pregnant, may be pregnant, or are planning to become pregnant. If you drink alcohol: Limit how much you have to: 0-1 drink a day for women. 0-2 drinks a day for men. Know how much alcohol is in your drink. In the [place] one drink equals one 12 oz bottle of beer (355 mL), one 5 oz glass of wine (148 mL), or one 1 oz glass of hard liquor (44 mL). General instructions Take over-the-counter and prescription medicines only as told by your doctor. Do not smoke or use any products that contain nicotine or tobacco. If you need help quitting, ask your doctor. If you have diabetes, work with your doctor to make sure your blood sugar stays in a healthy range. If your feet feel numb: Check for redness, warmth, and swelling every day. Wear padded socks and comfortable shoes. These help protect your feet. Keep all follow-up visits. Contact a doctor if: You have paresthesia that gets worse or does not go away. You lose feeling (have numbness) after an injury. Your burning or prickling feeling gets worse when you walk. You have pain or cramps. You feel dizzy or you faint. You have a rash. Get help right away if: You feel weak or have new weakness in an arm or leg. You have trouble walking or  moving. You have problems speaking, understanding, or seeing. You feel confused. You cannot control when you pee (urinate) or poop (have a bowel movement). These symptoms may be an emergency. Get help right away. Call 911. Do not wait to see if the symptoms will go away. Do not drive yourself to the hospital. Summary Paresthesia is a burning or prickling feeling. It often happens in the hands, arms, legs, or feet. In most cases, the feeling goes away in a short time and is not a sign of a serious problem. If you have paresthesia that lasts a long time, you need to be seen by your doctor. This information is not intended to replace advice given to you by your health care provider. Make sure you discuss any questions you have with your health care provider. Document Revised: 04/26/2021 Document Reviewed: 04/26/2021 Elsevier Patient Education  2024 [employer].

## 2023-01-27 LAB — COMPLETE METABOLIC PANEL WITH GFR
AG Ratio: 2 (calc) (ref 1.0–2.5)
ALT: 38 U/L (ref 9–46)
AST: 23 U/L (ref 10–40)
Albumin: 5.3 g/dL — ABNORMAL HIGH (ref 3.6–5.1)
Alkaline phosphatase (APISO): 63 U/L (ref 36–130)
BUN/Creatinine Ratio: 15 (calc) (ref 6–22)
BUN: 19 mg/dL (ref 7–25)
CO2: 30 mmol/L (ref 20–32)
Calcium: 10.5 mg/dL — ABNORMAL HIGH (ref 8.6–10.3)
Chloride: 100 mmol/L (ref 98–110)
Creat: 1.28 mg/dL — ABNORMAL HIGH (ref 0.60–1.26)
Globulin: 2.6 g/dL (calc) (ref 1.9–3.7)
Glucose, Bld: 90 mg/dL (ref 65–99)
Potassium: 5.4 mmol/L — ABNORMAL HIGH (ref 3.5–5.3)
Sodium: 139 mmol/L (ref 135–146)
Total Bilirubin: 0.9 mg/dL (ref 0.2–1.2)
Total Protein: 7.9 g/dL (ref 6.1–8.1)
eGFR: 77 mL/min/{1.73_m2} (ref >=60)

## 2023-01-27 LAB — LIPID PANEL
Cholesterol: 223 mg/dL — ABNORMAL HIGH (ref <200)
HDL: 56 mg/dL (ref >=40)
LDL Cholesterol (Calc): 148 mg/dL (calc) — ABNORMAL HIGH
Non-HDL Cholesterol (Calc): 167 mg/dL (calc) — ABNORMAL HIGH (ref <130)
Total CHOL/HDL Ratio: 4 (calc) (ref <5.0)
Triglycerides: 85 mg/dL (ref <150)

## 2023-01-27 LAB — CBC WITH DIFFERENTIAL/PLATELET
Absolute Monocytes: 471 cells/uL (ref 200–950)
Basophils Absolute: 31 cells/uL (ref 0–200)
Basophils Relative: 0.5 %
Eosinophils Absolute: 143 cells/uL (ref 15–500)
Eosinophils Relative: 2.3 %
HCT: 47.7 % (ref 38.5–50.0)
Hemoglobin: 16 g/dL (ref 13.2–17.1)
Lymphs Abs: 2089 cells/uL (ref 850–3900)
MCH: 30.6 pg (ref 27.0–33.0)
MCHC: 33.5 g/dL (ref 32.0–36.0)
MCV: 91.2 fL (ref 80.0–100.0)
MPV: 9.2 fL (ref 7.5–12.5)
Monocytes Relative: 7.6 %
Neutro Abs: 3466 cells/uL (ref 1500–7800)
Neutrophils Relative %: 55.9 %
Platelets: 449 10*3/uL — ABNORMAL HIGH (ref 140–400)
RBC: 5.23 10*6/uL (ref 4.20–5.80)
RDW: 12.7 % (ref 11.0–15.0)
Total Lymphocyte: 33.7 %
WBC: 6.2 10*3/uL (ref 3.8–10.8)

## 2023-01-30 NOTE — Progress Notes (Signed)
Patient is aware of lab results and instructions. -e welch

## 2023-05-12 ENCOUNTER — Ambulatory Visit (INDEPENDENT_AMBULATORY_CARE_PROVIDER_SITE_OTHER): Payer: BC Managed Care – PPO | Admitting: Nurse Practitioner

## 2023-05-12 VITALS — BP 100/70 | HR 62 | Temp 97.7°F | Ht 66.0 in | Wt 141.8 lb

## 2023-05-12 DIAGNOSIS — Z79899 Other long term (current) drug therapy: Secondary | ICD-10-CM

## 2023-05-12 DIAGNOSIS — G8929 Other chronic pain: Secondary | ICD-10-CM

## 2023-05-12 DIAGNOSIS — E782 Mixed hyperlipidemia: Secondary | ICD-10-CM

## 2023-05-12 DIAGNOSIS — E559 Vitamin D deficiency, unspecified: Secondary | ICD-10-CM

## 2023-05-12 DIAGNOSIS — Z9109 Other allergy status, other than to drugs and biological substances: Secondary | ICD-10-CM | POA: Diagnosis not present

## 2023-05-12 DIAGNOSIS — M25562 Pain in left knee: Secondary | ICD-10-CM

## 2023-05-12 DIAGNOSIS — Z87898 Personal history of other specified conditions: Secondary | ICD-10-CM

## 2023-05-12 DIAGNOSIS — Z0001 Encounter for general adult medical examination with abnormal findings: Secondary | ICD-10-CM

## 2023-05-12 DIAGNOSIS — F418 Other specified anxiety disorders: Secondary | ICD-10-CM

## 2023-05-12 DIAGNOSIS — Z720 Tobacco use: Secondary | ICD-10-CM

## 2023-05-12 DIAGNOSIS — E875 Hyperkalemia: Secondary | ICD-10-CM
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
## 2023-05-13 LAB — CBC WITH DIFFERENTIAL/PLATELET
Absolute Monocytes: 442 {cells}/uL (ref 200–950)
Basophils Absolute: 32 {cells}/uL (ref 0–200)
Basophils Relative: 0.5 %
Eosinophils Absolute: 102 {cells}/uL (ref 15–500)
Eosinophils Relative: 1.6 %
HCT: 41.5 % (ref 38.5–50.0)
Hemoglobin: 14.3 g/dL (ref 13.2–17.1)
Lymphs Abs: 1843 {cells}/uL (ref 850–3900)
MCH: 31.2 pg (ref 27.0–33.0)
MCHC: 34.5 g/dL (ref 32.0–36.0)
MCV: 90.6 fL (ref 80.0–100.0)
MPV: 9.3 fL (ref 7.5–12.5)
Monocytes Relative: 6.9 %
Neutro Abs: 3981 {cells}/uL (ref 1500–7800)
Neutrophils Relative %: 62.2 %
Platelets: 314 10*3/uL (ref 140–400)
RBC: 4.58 10*6/uL (ref 4.20–5.80)
RDW: 12.8 % (ref 11.0–15.0)
Total Lymphocyte: 28.8 %
WBC: 6.4 10*3/uL (ref 3.8–10.8)

## 2023-05-13 LAB — COMPLETE METABOLIC PANEL WITH GFR
AG Ratio: 2 (calc) (ref 1.0–2.5)
ALT: 14 U/L (ref 9–46)
AST: 18 U/L (ref 10–40)
Albumin: 4.7 g/dL (ref 3.6–5.1)
Alkaline phosphatase (APISO): 51 U/L (ref 36–130)
BUN: 13 mg/dL (ref 7–25)
CO2: 28 mmol/L (ref 20–32)
Calcium: 9.6 mg/dL (ref 8.6–10.3)
Chloride: 102 mmol/L (ref 98–110)
Creat: 1.08 mg/dL (ref 0.60–1.26)
Globulin: 2.4 g/dL (ref 1.9–3.7)
Glucose, Bld: 85 mg/dL (ref 65–99)
Potassium: 4.4 mmol/L (ref 3.5–5.3)
Sodium: 138 mmol/L (ref 135–146)
Total Bilirubin: 0.7 mg/dL (ref 0.2–1.2)
Total Protein: 7.1 g/dL (ref 6.1–8.1)
eGFR: 94 mL/min/{1.73_m2} (ref >=60)

## 2023-05-13 LAB — LIPID PANEL
Cholesterol: 237 mg/dL — ABNORMAL HIGH (ref <200)
HDL: 59 mg/dL (ref >=40)
LDL Cholesterol (Calc): 159 mg/dL — ABNORMAL HIGH
Non-HDL Cholesterol (Calc): 178 mg/dL — ABNORMAL HIGH (ref <130)
Total CHOL/HDL Ratio: 4 (calc) (ref <5.0)
Triglycerides: 83 mg/dL (ref <150)

## 2023-05-21 NOTE — Patient Instructions (Signed)

## 2023-07-05 ENCOUNTER — Ambulatory Visit (INDEPENDENT_AMBULATORY_CARE_PROVIDER_SITE_OTHER): Payer: BC Managed Care – PPO | Admitting: Nurse Practitioner

## 2023-07-05 ENCOUNTER — Encounter: Payer: Self-pay | Admitting: Nurse Practitioner

## 2023-07-05 VITALS — BP 118/72 | HR 76 | Temp 98.6°F | Ht 66.0 in | Wt 145.4 lb

## 2023-07-05 DIAGNOSIS — G8929 Other chronic pain: Secondary | ICD-10-CM

## 2023-07-05 DIAGNOSIS — Z0001 Encounter for general adult medical examination with abnormal findings: Secondary | ICD-10-CM

## 2023-07-05 DIAGNOSIS — Z87898 Personal history of other specified conditions: Secondary | ICD-10-CM

## 2023-07-05 DIAGNOSIS — Z0000 Encounter for general adult medical examination without abnormal findings: Secondary | ICD-10-CM

## 2023-07-05 DIAGNOSIS — F418 Other specified anxiety disorders: Secondary | ICD-10-CM

## 2023-07-05 DIAGNOSIS — Z136 Encounter for screening for cardiovascular disorders: Secondary | ICD-10-CM | POA: Diagnosis not present

## 2023-07-05 DIAGNOSIS — M25512 Pain in left shoulder: Secondary | ICD-10-CM

## 2023-07-05 DIAGNOSIS — Z9109 Other allergy status, other than to drugs and biological substances: Secondary | ICD-10-CM

## 2023-07-05 DIAGNOSIS — Z79899 Other long term (current) drug therapy: Secondary | ICD-10-CM

## 2023-07-05 DIAGNOSIS — I10 Essential (primary) hypertension: Secondary | ICD-10-CM

## 2023-07-05 DIAGNOSIS — E559 Vitamin D deficiency, unspecified: Secondary | ICD-10-CM

## 2023-07-05 DIAGNOSIS — Z720 Tobacco use: Secondary | ICD-10-CM

## 2023-07-05 DIAGNOSIS — Z8249 Family history of ischemic heart disease and other diseases of the circulatory system: Secondary | ICD-10-CM

## 2023-07-05 DIAGNOSIS — E782 Mixed hyperlipidemia: Secondary | ICD-10-CM
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
## 2023-07-05 NOTE — Patient Instructions (Signed)

## 2023-07-13 ENCOUNTER — Ambulatory Visit (HOSPITAL_BASED_OUTPATIENT_CLINIC_OR_DEPARTMENT_OTHER): Payer: Self-pay | Attending: Nurse Practitioner

## 2023-10-10 ENCOUNTER — Encounter: Payer: Self-pay | Admitting: *Deleted

## 2023-10-20 ENCOUNTER — Ambulatory Visit: Payer: BC Managed Care – PPO | Admitting: Nurse Practitioner

## 2023-11-02 ENCOUNTER — Encounter: Payer: Self-pay | Admitting: *Deleted

## 2024-01-15 DIAGNOSIS — R109 Unspecified abdominal pain: Secondary | ICD-10-CM | POA: Diagnosis not present

## 2024-01-15 DIAGNOSIS — R1011 Right upper quadrant pain: Secondary | ICD-10-CM | POA: Diagnosis not present

## 2024-01-15 DIAGNOSIS — R1031 Right lower quadrant pain: Secondary | ICD-10-CM | POA: Diagnosis not present

## 2024-02-05 DIAGNOSIS — D696 Thrombocytopenia, unspecified: Secondary | ICD-10-CM | POA: Diagnosis not present

## 2024-04-17 DIAGNOSIS — R634 Abnormal weight loss: Secondary | ICD-10-CM | POA: Diagnosis not present

## 2024-04-17 DIAGNOSIS — R5383 Other fatigue: Secondary | ICD-10-CM | POA: Diagnosis not present

## 2024-04-17 DIAGNOSIS — R110 Nausea: Secondary | ICD-10-CM | POA: Diagnosis not present

## 2024-04-17 DIAGNOSIS — I499 Cardiac arrhythmia, unspecified: Secondary | ICD-10-CM | POA: Diagnosis not present

## 2024-04-22 NOTE — Progress Notes (Unsigned)
 OFFICE NOTE:    Date:  04/23/2024  ID:  Aaron Hester, DOB 10/06/1990, MRN [id number] PCP: Laurice President, NP  [facility] Providers Cardiologist:  None        Hyperlipidemia  Pre-diabetes  Former smoker  Depression        Discussed the use of AI scribe software for clinical note transcription with the patient, who gave verbal consent to proceed. History of Present Illness Aaron Hester is a 33 y.o. male who is referred by Cranford, Tonya, NP for bradycardia. Notes indicate he has a noted arrhythmia and symptoms of nausea.   He was noted to have a heart rate of 41, and his primary care provider expressed concerns for a possible sinus pause. He has no history of heart attacks, heart failure, or arrhythmias such as atrial fibrillation. He recalls being told years ago that his heart would 'almost kind of like skip a beat,' but has not heard anything about it since then. No chest discomfort, shortness of breath, or dizziness during physical activities, including regular gym workouts. He experiences lightheadedness when standing up from squatting and reports being 'always exhausted,' even before gym sessions. No episodes of syncope. He reports occasional nausea after eating large amounts, which he attributes to his efforts to maintain weight while working out frequently.  He has a history of high cholesterol, which was last checked about six to seven months ago. Despite dietary changes and a trial of atorvastatin , his cholesterol levels did not improve significantly, and he experienced elevated liver levels while on the medication, leading to its discontinuation.   Family history is significant for his mother having a heart attack at the age of 14. No other family history of heart attacks, heart failure, or sudden deaths.  Socially, he works as an Camera operator for a [employer], is married, and has one daughter and a Microbiologist. He drinks alcohol infrequently, having had one beer in the  past three weeks, and denies drug use. He used to smoke and currently chews tobacco. He exercises regularly, going to the gym four to five days a week, sometimes twice a day, focusing on calisthenics and weight lifting.    ROS-See HPI    Studies Reviewed:  EKG Interpretation Date/Time:  Tuesday April 23 2024 08:52:18 EDT Ventricular Rate:  60 PR Interval:  142 QRS Duration:  96 QT Interval:  372 QTC Calculation: 372 R Axis:   71  Text Interpretation: Sinus rhythm with Blocked Premature atrial complexes No previous ECGs available Confirmed by Lelon Hamilton [phone]) on 04/23/2024 9:33:44 AM    Labs from PCP  01/16/24: K 4.5, ALT 30, SCr 1.12, Hgb 14.9, PLT 523K 02/05/24: SCr 1.16, K 3.8, ALT 23, Hgb 14.2  Results LABS - Primary care records reviewed and interpreted by me today 04/23/2024  Hemoglobin: 14.9 (01/15/2024) Creatinine: 1.12 (01/15/2024) Potassium: 4.5 (01/15/2024) ALT: 30 (01/15/2024) LDL: 159 (04/2023)         Physical Exam:  VS:  BP (!) 100/56 (BP Location: Left Arm, Patient Position: Sitting, Cuff Size: Normal)   Pulse 60   Ht 5' 7 (1.702 m)   Wt 139 lb (63 kg)   SpO2 99%   BMI 21.77 kg/m        Wt Readings from Last 3 Encounters:  04/23/24 139 lb (63 kg)  07/05/23 145 lb 6.4 oz (66 kg)  05/12/23 141 lb 12.8 oz (64.3 kg)    Constitutional:      Appearance: Healthy appearance. Not in  distress.  Neck:     Vascular: No carotid bruit. JVD normal.  Pulmonary:     Breath sounds: Normal breath sounds. No wheezing. No rales.  Cardiovascular:     Normal rate. Irregular rhythm.     Murmurs: There is no murmur.  Edema:    Peripheral edema absent.  Abdominal:     Palpations: Abdomen is soft.       Assessment and Plan:    Assessment & Plan Bradycardia His EKG shows sinus arrhythmia. Notes indicate he had a pause at some point documented in the past and HR in the 40s. He is completely asymptomatic. He exercises all the time. He likely has a high vagal tone.  Will get a 3 day monitor to rule out any high grade HB or significant pauses. Otherwise, he can follow up as needed based upon monitor results. -3 day Zio XT monitor -Follow up PRN Mixed hyperlipidemia 10 year ASCVD risk is 0.4%. He can continue to manage hyperlipidemia with diet. Once he is 13, with a FHx of CAD, it may be good to consider coronary artery Ca2+ score to assess risk further. He can arrange this with primary care.          Dispo:  Return for follow up as needed based upon test results.  Signed, Glendia Ferrier, PA-C

## 2024-04-23 ENCOUNTER — Ambulatory Visit

## 2024-04-23 ENCOUNTER — Ambulatory Visit: Attending: Physician Assistant | Admitting: Physician Assistant

## 2024-04-23 ENCOUNTER — Encounter: Payer: Self-pay | Admitting: Physician Assistant

## 2024-04-23 VITALS — BP 100/56 | HR 60 | Ht 67.0 in | Wt 139.0 lb

## 2024-04-23 DIAGNOSIS — E782 Mixed hyperlipidemia: Secondary | ICD-10-CM

## 2024-04-23 DIAGNOSIS — R001 Bradycardia, unspecified: Secondary | ICD-10-CM | POA: Diagnosis not present

## 2024-04-23 NOTE — Patient Instructions (Signed)
 Medication Instructions:  Your physician recommends that you continue on your current medications as directed. Please refer to the Current Medication list given to you today.  *If you need a refill on your cardiac medications before your next appointment, please call your pharmacy*  Lab Work: None ordered  If you have labs (blood work) drawn today and your tests are completely normal, you will receive your results only by: MyChart Message (if you have MyChart) OR A paper copy in the mail If you have any lab test that is abnormal or we need to change your treatment, we will call you to review the results.  Testing/Procedures: Aaron Hester- Long Term Monitor Instructions  Your physician has requested you wear a ZIO patch monitor for 3 days MAKE SURE YOU GO TO THE GYM AT LEAST 1 DAY WHILE YOU ARE WEARING.  This is a single patch monitor. [employer] supplies one patch monitor per enrollment. Additional stickers are not available. Please do not apply patch if you will be having a Nuclear Stress Test,  Echocardiogram, Cardiac CT, MRI, or Chest Xray during the period you would be wearing the  monitor. The patch cannot be worn during these tests. You cannot remove and re-apply the  ZIO XT patch monitor.  Your ZIO patch monitor will be mailed 3 day USPS to your address on file. It may take 3-5 days  to receive your monitor after you have been enrolled.  Once you have received your monitor, please review the enclosed instructions. Your monitor  has already been registered assigning a specific monitor serial # to you.  Billing and Patient Assistance Program Information  We have supplied [employer] with any of your insurance information on file for billing purposes. [employer] offers a sliding scale Patient Assistance Program for patients that do not have  insurance, or whose insurance does not completely cover the cost of the ZIO monitor.  You must apply for the Patient Assistance Program to qualify for this  discounted rate.  To apply, please call [employer] at [phone], select option 4, select option 2, ask to apply for  Patient Assistance Program. Aaron Hester will ask your household income, and how many people  are in your household. They will quote your out-of-pocket cost based on that information.  [employer] will also be able to set up a 40-month, interest-free payment plan if needed.  Applying the monitor   Shave hair from upper left chest.  Hold abrader disc by orange tab. Rub abrader in 40 strokes over the upper left chest as  indicated in your monitor instructions.  Clean area with 4 enclosed alcohol pads. Let dry.  Apply patch as indicated in monitor instructions. Patch will be placed under collarbone on left  side of chest with arrow pointing upward.  Rub patch adhesive wings for 2 minutes. Remove white label marked 1. Remove the white  label marked 2. Rub patch adhesive wings for 2 additional minutes.  While looking in a mirror, press and release button in center of patch. A small green light will  flash 3-4 times. This will be your only indicator that the monitor has been turned on.  Do not shower for the first 24 hours. You may shower after the first 24 hours.  Press the button if you feel a symptom. You will hear a small click. Record Date, Time and  Symptom in the Patient Logbook.  When you are ready to remove the patch, follow instructions on the last 2 pages of Patient  Logbook.  Stick patch monitor onto the last page of Patient Logbook.  Place Patient Logbook in the blue and white box. Use locking tab on box and tape box closed  securely. The blue and white box has prepaid postage on it. Please place it in the mailbox as  soon as possible. Your physician should have your test results approximately 7 days after the  monitor has been mailed back to [facility].  Call [facility] Customer Care at [phone] if you have questions regarding  your ZIO XT patch monitor. Call  them immediately if you see an orange light blinking on your  monitor.  If your monitor falls off in less than 4 days, contact our Monitor department at [phone].  If your monitor becomes loose or falls off after 4 days call [employer] at [phone] for  suggestions on securing your monitor   Follow-Up: At [facility], you and your health needs are our priority.  As part of our continuing mission to provide you with exceptional heart care, our providers are all part of one team.  This team includes your primary Cardiologist (physician) and Advanced Practice Providers or APPs (Physician Assistants and Nurse Practitioners) who all work together to provide you with the care you need, when you need it.  Your next appointment:   AS NEEDED   Provider:   Glendia Ferrier, PA-C          We recommend signing up for the patient portal called MyChart.  Sign up information is provided on this After Visit Summary.  MyChart is used to connect with patients for Virtual Visits (Telemedicine).  Patients are able to view lab/test results, encounter notes, upcoming appointments, etc.  Non-urgent messages can be sent to your provider as well.   To learn more about what you can do with MyChart, go to [email or web address].   Other Instructions

## 2024-04-23 NOTE — Progress Notes (Unsigned)
Enrolled patient for a 3 day Zio XT monitor to be mailed to patients home   DOD to read

## 2024-04-23 NOTE — Assessment & Plan Note (Signed)
 10 year ASCVD risk is 0.4%. He can continue to manage hyperlipidemia with diet. Once he is 76, with a FHx of CAD, it may be good to consider coronary artery Ca2+ score to assess risk further. He can arrange this with primary care.

## 2024-05-06 DIAGNOSIS — R001 Bradycardia, unspecified: Secondary | ICD-10-CM | POA: Diagnosis not present

## 2024-05-07 ENCOUNTER — Encounter: Payer: Self-pay | Admitting: Physician Assistant

## 2024-05-07 ENCOUNTER — Ambulatory Visit: Payer: Self-pay | Admitting: Physician Assistant

## 2024-05-07 DIAGNOSIS — R001 Bradycardia, unspecified: Secondary | ICD-10-CM | POA: Diagnosis not present

## 2024-05-10 NOTE — Progress Notes (Signed)
 Pt has been made aware of normal result and verbalized understanding.  jw

## 2024-05-17 DIAGNOSIS — I498 Other specified cardiac arrhythmias: Secondary | ICD-10-CM | POA: Diagnosis not present

## 2024-05-17 DIAGNOSIS — R5383 Other fatigue: Secondary | ICD-10-CM | POA: Diagnosis not present

## 2024-05-17 DIAGNOSIS — G4700 Insomnia, unspecified: Secondary | ICD-10-CM | POA: Diagnosis not present

## 2024-07-04 ENCOUNTER — Encounter: Payer: BC Managed Care – PPO | Admitting: Nurse Practitioner
